# Patient Record
Sex: Female | Born: 1975 | Race: Black or African American | Hispanic: No | Marital: Single | Smoking: Never smoker
Health system: Southern US, Community
[De-identification: ages and names within clinical notes are randomized; demographics above are authoritative.]

## PROBLEM LIST (undated history)

## (undated) DIAGNOSIS — E785 Hyperlipidemia, unspecified: Secondary | ICD-10-CM

## (undated) DIAGNOSIS — I10 Essential (primary) hypertension: Secondary | ICD-10-CM

## (undated) DIAGNOSIS — E119 Type 2 diabetes mellitus without complications: Secondary | ICD-10-CM

## (undated) HISTORY — DX: Essential (primary) hypertension: I10

## (undated) HISTORY — DX: Hyperlipidemia, unspecified: E78.5

## (undated) HISTORY — DX: Type 2 diabetes mellitus without complications: E11.9

---

## 2018-02-11 ENCOUNTER — Telehealth: Payer: Self-pay | Admitting: Medical

## 2018-02-11 ENCOUNTER — Encounter: Payer: Self-pay | Admitting: Medical

## 2018-02-11 ENCOUNTER — Ambulatory Visit: Payer: BC Managed Care – PPO | Admitting: Medical

## 2018-02-11 VITALS — BP 175/92 | HR 93 | Temp 99.0°F | Resp 16 | Ht 66.0 in | Wt 226.0 lb

## 2018-02-11 DIAGNOSIS — I10 Essential (primary) hypertension: Secondary | ICD-10-CM | POA: Diagnosis not present

## 2018-02-11 DIAGNOSIS — Z23 Encounter for immunization: Secondary | ICD-10-CM | POA: Diagnosis not present

## 2018-02-11 DIAGNOSIS — R739 Hyperglycemia, unspecified: Secondary | ICD-10-CM | POA: Diagnosis not present

## 2018-02-11 DIAGNOSIS — R5383 Other fatigue: Secondary | ICD-10-CM | POA: Diagnosis not present

## 2018-02-11 LAB — LIPID PANEL
CHOLESTEROL: 164 mg/dL (ref 0–200)
HDL: 31.7 mg/dL — ABNORMAL LOW (ref >39.00)
LDL Cholesterol: 112 mg/dL — ABNORMAL HIGH (ref 0–99)
NonHDL: 132.72
Total CHOL/HDL Ratio: 5
Triglycerides: 104 mg/dL (ref 0.0–149.0)
VLDL: 20.8 mg/dL (ref 0.0–40.0)

## 2018-02-11 LAB — COMPREHENSIVE METABOLIC PANEL
ALBUMIN: 4.1 g/dL (ref 3.5–5.2)
ALT: 9 U/L (ref 0–35)
AST: 9 U/L (ref 0–37)
Alkaline Phosphatase: 88 U/L (ref 39–117)
BUN: 9 mg/dL (ref 6–23)
CO2: 28 mEq/L (ref 19–32)
Calcium: 9.4 mg/dL (ref 8.4–10.5)
Chloride: 100 mEq/L (ref 96–112)
Creatinine, Ser: 0.58 mg/dL (ref 0.40–1.20)
GFR: 144.89 mL/min (ref >60.00)
Glucose, Bld: 256 mg/dL — ABNORMAL HIGH (ref 70–99)
Potassium: 3.8 mEq/L (ref 3.5–5.1)
Sodium: 136 mEq/L (ref 135–145)
Total Bilirubin: 0.4 mg/dL (ref 0.2–1.2)
Total Protein: 7.3 g/dL (ref 6.0–8.3)

## 2018-02-11 LAB — CBC WITH DIFFERENTIAL/PLATELET
Basophils Absolute: 0.1 10*3/uL (ref 0.0–0.1)
Basophils Relative: 0.9 % (ref 0.0–3.0)
Eosinophils Absolute: 0.1 10*3/uL (ref 0.0–0.7)
Eosinophils Relative: 0.7 % (ref 0.0–5.0)
HCT: 34.8 % — ABNORMAL LOW (ref 36.0–46.0)
Hemoglobin: 10.9 g/dL — ABNORMAL LOW (ref 12.0–15.0)
Lymphocytes Relative: 25.5 % (ref 12.0–46.0)
Lymphs Abs: 2.5 10*3/uL (ref 0.7–4.0)
MCHC: 31.3 g/dL (ref 30.0–36.0)
MCV: 73.3 fl — ABNORMAL LOW (ref 78.0–100.0)
Monocytes Absolute: 0.4 10*3/uL (ref 0.1–1.0)
Monocytes Relative: 3.8 % (ref 3.0–12.0)
Neutro Abs: 6.8 10*3/uL (ref 1.4–7.7)
Neutrophils Relative %: 69.1 % (ref 43.0–77.0)
Platelets: 541 10*3/uL — ABNORMAL HIGH (ref 150.0–400.0)
RBC: 4.75 Mil/uL (ref 3.87–5.11)
RDW: 16.5 % — ABNORMAL HIGH (ref 11.5–15.5)
WBC: 9.8 10*3/uL (ref 4.0–10.5)

## 2018-02-11 LAB — VITAMIN B12: Vitamin B-12: 172 pg/mL — ABNORMAL LOW (ref 211–911)

## 2018-02-11 LAB — HEMOGLOBIN A1C: HEMOGLOBIN A1C: 12.3 % — AB (ref 4.6–6.5)

## 2018-02-11 LAB — TSH: TSH: 1.25 u[IU]/mL (ref 0.35–4.50)

## 2018-02-11 MED ORDER — LOSARTAN POTASSIUM 100 MG PO TABS: 100.0000 mg | ORAL_TABLET | Freq: Every day | ORAL | 3 refills | Status: DC

## 2018-02-11 MED ORDER — ATORVASTATIN CALCIUM 20 MG PO TABS: 20.0000 mg | ORAL_TABLET | Freq: Every day | ORAL | 3 refills | Status: DC

## 2018-02-11 MED ORDER — METFORMIN HCL 500 MG PO TABS: ORAL_TABLET | ORAL | 3 refills | Status: DC

## 2018-02-11 MED ORDER — HYDROCHLOROTHIAZIDE 12.5 MG PO CAPS: 12.5000 mg | ORAL_CAPSULE | Freq: Every day | ORAL | 3 refills | Status: DC

## 2018-02-11 MED ORDER — GLIPIZIDE 5 MG PO TABS: 5.0000 mg | ORAL_TABLET | Freq: Every day | ORAL | 3 refills | Status: DC

## 2018-02-11 NOTE — Telephone Encounter (Signed)
Rx metfomrin, glipizide and atorvastatin sent to pt pharmacy.

## 2018-02-11 NOTE — Patient Instructions (Signed)
Your blood pressure is very high today.  But you had normal neurologic exam.  I did prescribe you both losartan and HCTZ.  This medication should gradually bring your blood pressure to a more reasonable level.  If you have any cardiac or neurologic signs or symptoms over the holiday weekend then recommend ED evaluation.  Recommended to get electronic blood pressure cuff over-the-counter and asked to check your blood pressures daily.  You might take 3 to 4 days before you start to see blood pressure come down significantly.  You have a history of elevated sugar in the past and will get 2457-month blood sugar average test today.  For history of fatigue, will get CBC, CMP, B12, B1 level and a vitamin D level.  You have moderate high level depression score today.  Though you report not being depressed.  I want you to be aware of your moods and if he is you start to have overt feelings of depression then could give medication.  Follow-up in 10 to 14 days or as needed.

## 2018-02-11 NOTE — Progress Notes (Signed)
Subjective:    Patient ID: Adrienne Thompson, female    DOB: [birth year], 42 y.o.   MRN: [id number]  HPI  Pt in for first time.  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 She does not exercise regularly. She admits not eating healthy. Nonsmoker. No alcohol. Single. Has one son.  Pt has no regular pcp.  Pt has no hx of htn. But her bp was initially high. No cardiac or neurologic signs or symptoms. Pt had high bp at dentist office recently. Her bp was 210/110 and 209/116 in November.  Some daily mild fatigue   Pt has some elevated blood sugar in the past. Pt thinks she was diagnosed with prediabetes about 5 years ago.  Patient has not had flu vaccine this year and she requests one today.  Review of Systems  Constitutional: Positive for fatigue. Negative for chills and unexpected weight change.       Mild fatigue for 2 months.  HENT: Negative for congestion and ear discharge.   Respiratory: Negative for chest tightness, shortness of breath and wheezing.   Cardiovascular: Negative for chest pain and palpitations.  Gastrointestinal: Negative for abdominal pain.  Musculoskeletal: Negative for back pain.       No leg pain  Neurological: Negative for dizziness, syncope, weakness, numbness and headaches.  Hematological: Negative for adenopathy. Does not bruise/bleed easily.  Psychiatric/Behavioral: Negative for behavioral problems, confusion and sleep disturbance. The patient is not nervous/anxious.        High phq-9 score of 10. But a lot of points maybe attributed to her fatigue and poor sleep. She denies feeling depressed.    Past Medical History:  Diagnosis Date  . Diabetes mellitus without complication (HCC)   . Hypertension      Social History   Socioeconomic History  . Marital status: Single    Spouse name: Not on file  . Number of children: Not on file  . Years of education: Not on file  . Highest education level: Not on file  Occupational History  . Not on file  Social Needs  .  Financial resource strain: Not on file  . Food insecurity:    Worry: Not on file    Inability: Not on file  . Transportation needs:    Medical: Not on file    Non-medical: Not on file  Tobacco Use  . Smoking status: Never Smoker  . Smokeless tobacco: Never Used  Substance and Sexual Activity  . Alcohol use: Never    Frequency: Never  . Drug use: Never  . Sexual activity: Not on file  Lifestyle  . Physical activity:    Days per week: Not on file    Minutes per session: Not on file  . Stress: Not on file  Relationships  . Social connections:    Talks on phone: Not on file    Gets together: Not on file    Attends religious service: Not on file    Active member of club or organization: Not on file    Attends meetings of clubs or organizations: Not on file    Relationship status: Not on file  . Intimate partner violence:    Fear of current or ex partner: Not on file    Emotionally abused: Not on file    Physically abused: Not on file    Forced sexual activity: Not on file  Other Topics Concern  . Not on file  Social History Narrative  . Not on file    History reviewed.  No pertinent surgical history.  Family History  Problem Relation Age of Onset  . Hypertension Mother   . Diabetes Mother     Not on File  No current outpatient medications on file prior to visit.   No current facility-administered medications on file prior to visit.     BP (!) 176/98   Pulse 93   Temp 99 F (37.2 C) (Oral)   Resp 16   Ht 5\' 6"  (1.676 m)   Wt 226 lb (102.5 kg)   LMP 12/26/2017   SpO2 100%   BMI 36.48 kg/m       Objective:   Physical Exam  General Mental Status- Alert. General Appearance- Not in acute distress.   Skin General: Color- Normal Color. Moisture- Normal Moisture.  Neck Carotid Arteries- Normal color. Moisture- Normal Moisture. No carotid bruits. No JVD.  Chest and Lung Exam Auscultation: Breath Sounds:-Normal.  Cardiovascular Auscultation:Rythm-  Regular. Murmurs & Other Heart Sounds:Auscultation of the heart reveals- No Murmurs.  Abdomen Inspection:-Inspeection Normal. Palpation/Percussion:Note:No mass. Palpation and Percussion of the abdomen reveal- Non Tender, Non Distended + BS, no rebound or guarding.   Neurologic Cranial Nerve exam:- CN III-XII intact(No nystagmus), symmetric smile. Drift Test:- No drift. Romberg Exam:- Negative.  Heal to Toe Gait exam:-Normal. Finger to Nose:- Normal/Intact Strength:- 5/5 equal and symmetric strength both upper and lower extremities.      Assessment & Plan:  Your blood pressure is very high today.  But you had normal neurologic exam.  I did prescribe you both losartan and HCTZ.  This medication should gradually bring your blood pressure to a more reasonable level.  If you have any cardiac or neurologic signs or symptoms over the holiday weekend then recommend ED evaluation.  Recommended to get electronic blood pressure cuff over-the-counter and asked to check your blood pressures daily.  You might take 3 to 4 days before you start to see blood pressure come down significantly.  You have a history of elevated sugar in the past and will get 5959-month blood sugar average test today.  For history of fatigue, will get CBC, CMP, B12, B1 level and a vitamin D level.  You have moderate high level depression score today.  Though you report not being depressed.  I want you to be aware of your moods and if he is you start to have overt feelings of depression then could give medication.  Follow-up in 10 to 14 days or as needed.  Esperanza RichtersEdward Latonya Knight, PA-C

## 2018-02-15 ENCOUNTER — Telehealth: Payer: Self-pay | Admitting: Medical

## 2018-02-15 LAB — VITAMIN D 1,25 DIHYDROXY
VITAMIN D3 1, 25 (OH): 66 pg/mL
Vitamin D 1, 25 (OH)2 Total: 66 pg/mL (ref 18–72)
Vitamin D2 1, 25 (OH)2: <8 pg/mL

## 2018-02-15 LAB — VITAMIN B1: Vitamin B1 (Thiamine): <6 nmol/L — ABNORMAL LOW (ref 8–30)

## 2018-02-15 NOTE — Telephone Encounter (Signed)
Pt given results and recommendations per notes of Edward,PA on 02/05/18. Pt verbalized understanding.Unable to document in result note due to [facility].

## 2018-02-18 ENCOUNTER — Encounter: Payer: Self-pay | Admitting: Medical

## 2018-02-18 ENCOUNTER — Ambulatory Visit: Payer: BC Managed Care – PPO | Admitting: Medical

## 2018-02-18 VITALS — BP 154/92 | HR 100 | Temp 98.4°F | Resp 16 | Ht 66.0 in | Wt 222.4 lb

## 2018-02-18 DIAGNOSIS — Z23 Encounter for immunization: Secondary | ICD-10-CM

## 2018-02-18 DIAGNOSIS — I10 Essential (primary) hypertension: Secondary | ICD-10-CM | POA: Diagnosis not present

## 2018-02-18 DIAGNOSIS — E538 Deficiency of other specified B group vitamins: Secondary | ICD-10-CM | POA: Diagnosis not present

## 2018-02-18 DIAGNOSIS — E1165 Type 2 diabetes mellitus with hyperglycemia: Secondary | ICD-10-CM

## 2018-02-18 DIAGNOSIS — E1169 Type 2 diabetes mellitus with other specified complication: Secondary | ICD-10-CM | POA: Diagnosis not present

## 2018-02-18 DIAGNOSIS — E785 Hyperlipidemia, unspecified: Secondary | ICD-10-CM

## 2018-02-18 MED ORDER — CYANOCOBALAMIN 1000 MCG/ML IJ SOLN
1000.0000 ug | Freq: Once | INTRAMUSCULAR | Status: AC
  Administered 2018-02-18: 1000 ug via INTRAMUSCULAR

## 2018-02-18 MED ORDER — CHLORTHALIDONE 25 MG PO TABS: 25.0000 mg | ORAL_TABLET | Freq: Every day | ORAL | 3 refills | Status: DC

## 2018-02-18 NOTE — Progress Notes (Signed)
Subjective:    Patient ID: Adrienne Thompson, female    DOB: Sep 28, 1975, 42 y.o.   MRN: [id number]  HPI  Pt in for follow up. She is aware of high A1-C average. Pt started both metformin and glipizide.  Pt having some mild loose stools with metformin. Started out with 1 tab po bid dose.  Pt also on statin just recently.  She had low b12 on lab check. Will get b12 vitamin today.  Pt will get pneumonia vaccine today.      Review of Systems  Constitutional: Negative for chills, fatigue and fever.  HENT: Negative for congestion, ear discharge and sinus pain.   Respiratory: Negative for cough, chest tightness, shortness of breath and wheezing.   Cardiovascular: Negative for chest pain and palpitations.  Gastrointestinal: Negative for abdominal pain, constipation, nausea and vomiting.  Genitourinary: Negative for difficulty urinating, dysuria, enuresis, pelvic pain and urgency.  Musculoskeletal: Negative for back pain and neck pain.  Skin: Negative for rash.  Neurological: Negative for dizziness, speech difficulty, weakness and headaches.  Hematological: Negative for adenopathy. Does not bruise/bleed easily.  Psychiatric/Behavioral: Negative for behavioral problems, confusion, hallucinations and sleep disturbance. The patient is not nervous/anxious.     Past Medical History:  Diagnosis Date  . Diabetes mellitus without complication (HCC)   . Hypertension      Social History   Socioeconomic History  . Marital status: Single    Spouse name: Not on file  . Number of children: Not on file  . Years of education: Not on file  . Highest education level: Not on file  Occupational History  . Not on file  Social Needs  . Financial resource strain: Not on file  . Food insecurity:    Worry: Not on file    Inability: Not on file  . Transportation needs:    Medical: Not on file    Non-medical: Not on file  Tobacco Use  . Smoking status: Never Smoker  . Smokeless tobacco: Never Used   Substance and Sexual Activity  . Alcohol use: Never    Frequency: Never  . Drug use: Never  . Sexual activity: Not on file  Lifestyle  . Physical activity:    Days per week: Not on file    Minutes per session: Not on file  . Stress: Not on file  Relationships  . Social connections:    Talks on phone: Not on file    Gets together: Not on file    Attends religious service: Not on file    Active member of club or organization: Not on file    Attends meetings of clubs or organizations: Not on file    Relationship status: Not on file  . Intimate partner violence:    Fear of current or ex partner: Not on file    Emotionally abused: Not on file    Physically abused: Not on file    Forced sexual activity: Not on file  Other Topics Concern  . Not on file  Social History Narrative  . Not on file    No past surgical history on file.  Family History  Problem Relation Age of Onset  . Hypertension Mother   . Diabetes Mother     Not on File  Current Outpatient Medications on File Prior to Visit  Medication Sig Dispense Refill  . atorvastatin (LIPITOR) 20 MG tablet Take 1 tablet (20 mg total) by mouth daily. 30 tablet 3  . glipiZIDE (GLUCOTROL) 5 MG tablet  Take 1 tablet (5 mg total) by mouth daily before breakfast. 30 tablet 3  . hydrochlorothiazide (MICROZIDE) 12.5 MG capsule Take 1 capsule (12.5 mg total) by mouth daily. 30 capsule 3  . losartan (COZAAR) 100 MG tablet Take 1 tablet (100 mg total) by mouth daily. 30 tablet 3  . metFORMIN (GLUCOPHAGE) 500 MG tablet 2 tab po bid 60 tablet 3   No current facility-administered medications on file prior to visit.     BP (!) 154/92   Pulse 100   Temp 98.4 F (36.9 C) (Oral)   Resp 16   Ht 5\' 6"  (1.676 m)   Wt 222 lb 6.4 oz (100.9 kg)   SpO2 100%   BMI 35.90 kg/m       Objective:   Physical Exam  General Mental Status- Alert. General Appearance- Not in acute distress.   Skin General: Color- Normal Color. Moisture-  Normal Moisture.  Neck Carotid Arteries- Normal color. Moisture- Normal Moisture. No carotid bruits. No JVD.  Chest and Lung Exam Auscultation: Breath Sounds:-Normal.  Cardiovascular Auscultation:Rythm- Regular. Murmurs & Other Heart Sounds:Auscultation of the heart reveals- No Murmurs.  Abdomen Inspection:-Inspeection Normal. Palpation/Percussion:Note:No mass. Palpation and Percussion of the abdomen reveal- Non Tender, Non Distended + BS, no rebound or guarding.    Neurologic Cranial Nerve exam:- CN III-XII intact(No nystagmus), symmetric smile. Strength:- 5/5 equal and symmetric strength both upper and lower extremities.  Lower ext- see quality metrics.     Assessment & Plan:  For diabetes, I want you to continue with metformin and titrate up to 2 tablets twice daily as instructed.  Continue with the glipizide.  I have placed referral to diabetic/nutrition counselor.  Hopefully will call within the next couple weeks.  If not please notify me.  Your blood pressure has come down a lot since last visit.  Would recommend that you continue losartan.  I think it we will be better for you to be on chlorthalidone 25mg  daily and discontinue the HCTZ 12.5 mg.  Please make sure that you have potassium rich diet while on this diuretic.  For high cholesterol, continue with a statin medication as prescribed.  For low B12, we gave you B12 injection today.  Also we gave you pneumonia vaccine.  Please remember to call your insurance company and ask them if they have a glucometer that they will give you or if they can give you the name of their preferred brand glucometer.  This way we can send prescription to your pharmacy along with prescription for supplies.  I would like for you to check your sugars fasting in the morning and at least another time after you eat.  This way he will get idea of with particular foods make sugar increase.  Follow-up in 1 month or as needed.   Esperanza RichtersEdward Torrell Krutz,  PA-C

## 2018-02-18 NOTE — Patient Instructions (Signed)
For diabetes, I want you to continue with metformin and titrate up to 2 tablets twice daily as instructed.  Continue with the glipizide.  I have placed referral to diabetic/nutrition counselor.  Hopefully will call within the next couple weeks.  If not please notify me.  Your blood pressure has come down a lot since last visit.  Would recommend that you continue losartan.  I think it we will be better for you to be on chlorthalidone 25mg  daily and discontinue the HCTZ 12.5 mg.  Please make sure that you have potassium rich diet while on this diuretic.  For high cholesterol, continue with a statin medication as prescribed.  For low B12, we gave you B12 injection today.  Also we gave you pneumonia vaccine.  Please remember to call your insurance company and ask them if they have a glucometer that they will give you or if they can give you the name of their preferred brand glucometer.  This way we can send prescription to your pharmacy along with prescription for supplies.  I would like for you to check your sugars fasting in the morning and at least another time after you eat.  This way he will get idea of with particular foods make sugar increase.  Follow-up in 1 month or as needed.

## 2018-02-28 ENCOUNTER — Encounter: Payer: Self-pay | Admitting: Medical

## 2018-02-28 ENCOUNTER — Ambulatory Visit: Payer: BC Managed Care – PPO | Admitting: Medical

## 2018-02-28 VITALS — BP 132/80 | HR 92 | Temp 98.3°F | Resp 16 | Ht 66.0 in | Wt 221.6 lb

## 2018-02-28 DIAGNOSIS — E1169 Type 2 diabetes mellitus with other specified complication: Secondary | ICD-10-CM | POA: Diagnosis not present

## 2018-02-28 DIAGNOSIS — E1165 Type 2 diabetes mellitus with hyperglycemia: Secondary | ICD-10-CM | POA: Diagnosis not present

## 2018-02-28 DIAGNOSIS — E785 Hyperlipidemia, unspecified: Secondary | ICD-10-CM | POA: Diagnosis not present

## 2018-02-28 DIAGNOSIS — I10 Essential (primary) hypertension: Secondary | ICD-10-CM | POA: Diagnosis not present

## 2018-02-28 NOTE — Patient Instructions (Signed)
Your blood pressure is well controlled today.  I want you to continue your current blood pressure medication regimen and gave you recommend getting that over-the-counter blood pressure cuff so you can make sure that it is staying in the 130/80 range.  For diabetes, continue with the metformin and glipizide.  Would again remind you to touch base with your insurance company regarding if they can give you a free glucometer or the name of the preferred brand.  If you give [place] that name we can send in a prescription for the glucometer.  Once you get glucometer would recommend checking sugars fasting in the morning and then 1 other time after meal.  Continue Lipitor for high cholesterol and eat low-cholesterol diet.  Also recommend some daily exercise.  Follow-up late May 16, 2017 or as needed.

## 2018-02-28 NOTE — Progress Notes (Signed)
Subjective:    Patient ID: Adrienne Thompson, female    DOB: 24-Feb-1975, 43 y.o.   MRN: [id number]  HPI  Pt in for follow up on htn. Pt bp is better than it was. 131/88 on first check by MA. On second check by myself was 132/80. She has not gotten bp cuff otc as I had recommended.   Pt did start lipiitor medication.  Pt is taking meoformin and glipizide. She did not get [place] name of glucometer that insurance company prefers. Reminded her to do so and ask if they would give her free glucometer. Pt thinks she got call from diabetic educator. Asked please call them back.    Review of Systems  Constitutional: Negative for chills, fatigue and fever.  Respiratory: Negative for cough, chest tightness and wheezing.   Cardiovascular: Negative for chest pain and palpitations.  Gastrointestinal: Negative for abdominal pain.  Endocrine: Negative for polydipsia, polyphagia and polyuria.  Neurological: Negative for dizziness and headaches.  Hematological: Negative for adenopathy. Does not bruise/bleed easily.  Psychiatric/Behavioral: Negative for behavioral problems, decreased concentration and sleep disturbance. The patient is not nervous/anxious.     Past Medical History:  Diagnosis Date  . Diabetes mellitus without complication (HCC)   . Hypertension      Social History   Socioeconomic History  . Marital status: Single    Spouse name: Not on file  . Number of children: Not on file  . Years of education: Not on file  . Highest education level: Not on file  Occupational History  . Not on file  Social Needs  . Financial resource strain: Not on file  . Food insecurity:    Worry: Not on file    Inability: Not on file  . Transportation needs:    Medical: Not on file    Non-medical: Not on file  Tobacco Use  . Smoking status: Never Smoker  . Smokeless tobacco: Never Used  Substance and Sexual Activity  . Alcohol use: Never    Frequency: Never  . Drug use: Never  . Sexual activity:  Not on file  Lifestyle  . Physical activity:    Days per week: Not on file    Minutes per session: Not on file  . Stress: Not on file  Relationships  . Social connections:    Talks on phone: Not on file    Gets together: Not on file    Attends religious service: Not on file    Active member of club or organization: Not on file    Attends meetings of clubs or organizations: Not on file    Relationship status: Not on file  . Intimate partner violence:    Fear of current or ex partner: Not on file    Emotionally abused: Not on file    Physically abused: Not on file    Forced sexual activity: Not on file  Other Topics Concern  . Not on file  Social History Narrative  . Not on file    No past surgical history on file.  Family History  Problem Relation Age of Onset  . Hypertension Mother   . Diabetes Mother     Not on File  Current Outpatient Medications on File Prior to Visit  Medication Sig Dispense Refill  . atorvastatin (LIPITOR) 20 MG tablet Take 1 tablet (20 mg total) by mouth daily. 30 tablet 3  . chlorthalidone (HYGROTON) 25 MG tablet Take 1 tablet (25 mg total) by mouth daily. 30 tablet 3  .  glipiZIDE (GLUCOTROL) 5 MG tablet Take 1 tablet (5 mg total) by mouth daily before breakfast. 30 tablet 3  . losartan (COZAAR) 100 MG tablet Take 1 tablet (100 mg total) by mouth daily. 30 tablet 3  . metFORMIN (GLUCOPHAGE) 500 MG tablet 2 tab po bid 60 tablet 3   No current facility-administered medications on file prior to visit.     BP 132/80   Pulse 92   Temp 98.3 F (36.8 C) (Oral)   Resp 16   Ht 5\' 6"  (1.676 m)   Wt 221 lb 9.6 oz (100.5 kg)   SpO2 100%   BMI 35.77 kg/m       Objective:   Physical Exam  General- No acute distress. Pleasant patient. Neck- Full range of motion, no jvd Lungs- Clear, even and unlabored. Heart- regular rate and rhythm. Neurologic- CNII- XII grossly intact.       Assessment & Plan:  Your blood pressure is well controlled  today.  I want you to continue your current blood pressure medication regimen and gave you recommend getting that over-the-counter blood pressure cuff so you can make sure that it is staying in the 130/80 range.  For diabetes, continue with the metformin and glipizide.  Would again remind you to touch base with your insurance company regarding if they can give you a free glucometer or the name of the preferred brand.  If you give us that name we can send in a prescription for the glucometer.  Once you get glucometer would recommend checking sugars fasting in the morning and then 1 other time after meal.  Continue Lipitor for high cholesterol and eat low-cholesterol diet.  Also recommend some daily exercise.  Follow-up late May 16, 2017 or as needed.   25 minutes spent with pt. 50% of time spent on couselng pt on treatment plan, compliance and need to get glucometer and bp cuff.  Esperanza RichtersEdward Yariel Ferraris, PA-C

## 2018-05-16 ENCOUNTER — Other Ambulatory Visit: Payer: Self-pay

## 2018-05-16 ENCOUNTER — Ambulatory Visit (INDEPENDENT_AMBULATORY_CARE_PROVIDER_SITE_OTHER): Payer: BC Managed Care – PPO | Admitting: Medical

## 2018-05-16 DIAGNOSIS — E785 Hyperlipidemia, unspecified: Secondary | ICD-10-CM

## 2018-05-16 DIAGNOSIS — E538 Deficiency of other specified B group vitamins: Secondary | ICD-10-CM

## 2018-05-16 DIAGNOSIS — I10 Essential (primary) hypertension: Secondary | ICD-10-CM | POA: Diagnosis not present

## 2018-05-16 DIAGNOSIS — E1169 Type 2 diabetes mellitus with other specified complication: Secondary | ICD-10-CM | POA: Diagnosis not present

## 2018-05-16 DIAGNOSIS — E1165 Type 2 diabetes mellitus with hyperglycemia: Secondary | ICD-10-CM | POA: Diagnosis not present

## 2018-05-16 NOTE — Patient Instructions (Addendum)
For diabetes, I want patient to continue metformin twice daily and continue with sulfonylurea.  Did explain in detail benefit of knowing what her blood sugars are.  She has not got a glucometer yet.  Explained that she can call her insurance directly and see if they give her free glucometer or she can get the brand name of preferred glucometer.  If they give her the name of the glucometer then I could call/write a prescription for her to get that.  Once patient has glucometer I want her to check her blood sugars fasting in the morning and another time after she eats.  If her sugars are not in the 100 range then I think Ozempic might be a good option for her.  I asked her to call in blood sugar readings in 2 weeks or MyChart [place] the readings.  For high blood pressure history, I asked her to get a blood pressure machine over-the-counter.  I explained under the current environment that there might be a run on electronic blood pressure cuff.  So I asked her to go ahead and get one so she can check her blood pressure occasionally.  Particularly this would be beneficial if she gets any neurologic type signs or symptoms.  Patient expressed understanding.  For high cholesterol, advised continue statin medication and eat low-cholesterol diet.  Encouraged her to get daily exercise.  For history of low B12, I asked her to get over-the-counter B12 tablets and take 1 tablet a day.  Now since community is in a stay in place order she cannot get B12 injections.  When order is listed then will get a B12 level.  For follow-up date in office to be determined depending on viral pandemic situation.  But did encourage her to give [place] update on sugars in 2 weeks and to call us if there is any issues.

## 2018-05-16 NOTE — Progress Notes (Addendum)
Subjective:    Patient ID: Adrienne Thompson, female    DOB: 08-Aug-1975, 43 y.o.   MRN: [id number]  HPI  Pt doing web ex visit today.  This was private visit done in my office.  She has not been checking her sugars. A1c was 12.3 in December, 23 2019. She has not been exercising but cut back on her carbs by about 1/2. Pt states taking metformin and glyburide. She can only tolerate metformin 1 tab po bid.   Pt has low b12 hx. Now can't take b12 injection. She will get otc tabs.  Pt also had potential dental procedures but a lot of dentist have stopped doing elective procedures.  Pt expresses concern about   Pt does not have bp machine.     Review of Systems  Constitutional: Negative for chills, fatigue and fever.  HENT: Negative for congestion, drooling, facial swelling, mouth sores and postnasal drip.   Respiratory: Negative for cough, chest tightness, shortness of breath, wheezing and stridor.   Cardiovascular: Negative for chest pain and palpitations.  Gastrointestinal: Negative for abdominal distention, abdominal pain, blood in stool, diarrhea and rectal pain.  Endocrine: Negative for polydipsia, polyphagia and polyuria.  Genitourinary: Negative for dysuria, enuresis, flank pain, genital sores and pelvic pain.  Musculoskeletal: Negative for back pain, gait problem and joint swelling.  Skin: Negative for pallor and rash.  Neurological: Negative for dizziness, syncope, speech difficulty, weakness, numbness and headaches.  Hematological: Negative for adenopathy. Does not bruise/bleed easily.  Psychiatric/Behavioral: Negative for behavioral problems, confusion, dysphoric mood, hallucinations and sleep disturbance. The patient is not nervous/anxious.     Past Medical History:  Diagnosis Date  . Diabetes mellitus without complication (HCC)   . Hypertension      Social History   Socioeconomic History  . Marital status: Single    Spouse name: Not on file  . Number of children:  Not on file  . Years of education: Not on file  . Highest education level: Not on file  Occupational History  . Not on file  Social Needs  . Financial resource strain: Not on file  . Food insecurity:    Worry: Not on file    Inability: Not on file  . Transportation needs:    Medical: Not on file    Non-medical: Not on file  Tobacco Use  . Smoking status: Never Smoker  . Smokeless tobacco: Never Used  Substance and Sexual Activity  . Alcohol use: Never    Frequency: Never  . Drug use: Never  . Sexual activity: Not on file  Lifestyle  . Physical activity:    Days per week: Not on file    Minutes per session: Not on file  . Stress: Not on file  Relationships  . Social connections:    Talks on phone: Not on file    Gets together: Not on file    Attends religious service: Not on file    Active member of club or organization: Not on file    Attends meetings of clubs or organizations: Not on file    Relationship status: Not on file  . Intimate partner violence:    Fear of current or ex partner: Not on file    Emotionally abused: Not on file    Physically abused: Not on file    Forced sexual activity: Not on file  Other Topics Concern  . Not on file  Social History Narrative  . Not on file    No past  surgical history on file.  Family History  Problem Relation Age of Onset  . Hypertension Mother   . Diabetes Mother     Not on File  Current Outpatient Medications on File Prior to Visit  Medication Sig Dispense Refill  . atorvastatin (LIPITOR) 20 MG tablet Take 1 tablet (20 mg total) by mouth daily. 30 tablet 3  . chlorthalidone (HYGROTON) 25 MG tablet Take 1 tablet (25 mg total) by mouth daily. 30 tablet 3  . glipiZIDE (GLUCOTROL) 5 MG tablet Take 1 tablet (5 mg total) by mouth daily before breakfast. 30 tablet 3  . losartan (COZAAR) 100 MG tablet Take 1 tablet (100 mg total) by mouth daily. 30 tablet 3  . metFORMIN (GLUCOPHAGE) 500 MG tablet 2 tab po bid 60 tablet  3   No current facility-administered medications on file prior to visit.     There were no vitals taken for this visit.      Objective:   Physical Exam  . Na. webex.     Assessment & Plan:  Pt does not have fax number for me to send work note. He return to work would be this Friday. It is unclear what her bus duty would be.(she wants me to inlclude she is diabetic in the note. She also mentions they know this already).  For diabetes, I want patient to continue metformin twice daily and continue with sulfonylurea.  Did explain in detail benefit of knowing what her blood sugars are.  She has not got a glucometer yet.  Explained that she can call her insurance directly and see if they give her free glucometer or she can get the brand name of preferred glucometer.  If they give her the name of the glucometer then I could call/write a prescription for her to get that.  Once patient has glucometer I want her to check her blood sugars fasting in the morning and another time after she eats.  If her sugars are not in the 100 range then I think Ozempic might be a good option for her.  I asked her to call in blood sugar readings in 2 weeks or MyChart [place] the readings.  For high blood pressure history, I asked her to get a blood pressure machine over-the-counter.  I explained under the current environment that there might be a run on electronic blood pressure cuff.  So I asked her to go ahead and get one so she can check her blood pressure occasionally.  Particularly this would be beneficial if she gets any neurologic type signs or symptoms.  Patient expressed understanding.  For high cholesterol, advised continue statin medication and eat low-cholesterol diet.  Encouraged her to get daily exercise.  For history of low B12, I asked her to get over-the-counter B12 tablets and take 1 tablet a day.  Now since community is in a stay in place order she cannot get B12 injections.  When order is listed then  will get a B12 level.  For follow-up date in office to be determined depending on viral pandemic situation.  But did encourage her to give [place] update on sugars in 2 weeks and to call us if there is any issues.  40 minutes spent with patient today.  We discussed her chronic medical problems and I did answer all her questions.  Particularly discussed her risk factors in the event that she were to get covid infection.  Counseled her extensively on the importance verifying that her sugars are better controlled  and if not then I can give her additional medication.

## 2018-05-17 ENCOUNTER — Ambulatory Visit: Payer: BC Managed Care – PPO | Admitting: Medical

## 2018-07-30 ENCOUNTER — Telehealth: Payer: Self-pay | Admitting: Medical

## 2018-07-30 NOTE — Telephone Encounter (Signed)
Would you call pt and get her scheduled for first week of July. Follow up for diabetes and high cholesterol.

## 2018-08-01 NOTE — Telephone Encounter (Signed)
Left pt a message to call back to schedule appointment.  

## 2018-08-21 ENCOUNTER — Ambulatory Visit: Payer: BC Managed Care – PPO | Admitting: Medical

## 2018-08-21 ENCOUNTER — Encounter: Payer: Self-pay | Admitting: Medical

## 2018-08-21 ENCOUNTER — Telehealth: Payer: Self-pay | Admitting: Medical

## 2018-08-21 ENCOUNTER — Other Ambulatory Visit: Payer: Self-pay

## 2018-08-21 VITALS — BP 119/72 | HR 91 | Temp 98.6°F | Resp 16 | Ht 66.0 in | Wt 224.2 lb

## 2018-08-21 DIAGNOSIS — E119 Type 2 diabetes mellitus without complications: Secondary | ICD-10-CM

## 2018-08-21 DIAGNOSIS — Z1239 Encounter for other screening for malignant neoplasm of breast: Secondary | ICD-10-CM

## 2018-08-21 DIAGNOSIS — E538 Deficiency of other specified B group vitamins: Secondary | ICD-10-CM | POA: Diagnosis not present

## 2018-08-21 DIAGNOSIS — Z124 Encounter for screening for malignant neoplasm of cervix: Secondary | ICD-10-CM | POA: Diagnosis not present

## 2018-08-21 DIAGNOSIS — E1165 Type 2 diabetes mellitus with hyperglycemia: Secondary | ICD-10-CM

## 2018-08-21 DIAGNOSIS — I10 Essential (primary) hypertension: Secondary | ICD-10-CM

## 2018-08-21 DIAGNOSIS — E1169 Type 2 diabetes mellitus with other specified complication: Secondary | ICD-10-CM | POA: Diagnosis not present

## 2018-08-21 DIAGNOSIS — E785 Hyperlipidemia, unspecified: Secondary | ICD-10-CM

## 2018-08-21 DIAGNOSIS — Z0100 Encounter for examination of eyes and vision without abnormal findings: Secondary | ICD-10-CM

## 2018-08-21 DIAGNOSIS — Z23 Encounter for immunization: Secondary | ICD-10-CM | POA: Diagnosis not present

## 2018-08-21 LAB — COMPREHENSIVE METABOLIC PANEL
ALT: 26 U/L (ref 0–35)
AST: 27 U/L (ref 0–37)
Albumin: 4.1 g/dL (ref 3.5–5.2)
Alkaline Phosphatase: 89 U/L (ref 39–117)
BUN: 16 mg/dL (ref 6–23)
CO2: 26 mEq/L (ref 19–32)
Calcium: 9.5 mg/dL (ref 8.4–10.5)
Chloride: 95 mEq/L — ABNORMAL LOW (ref 96–112)
Creatinine, Ser: 1.25 mg/dL — ABNORMAL HIGH (ref 0.40–1.20)
GFR: 56.07 mL/min — ABNORMAL LOW (ref >60.00)
Glucose, Bld: 345 mg/dL — ABNORMAL HIGH (ref 70–99)
Potassium: 4 mEq/L (ref 3.5–5.1)
Sodium: 134 mEq/L — ABNORMAL LOW (ref 135–145)
Total Bilirubin: 0.5 mg/dL (ref 0.2–1.2)
Total Protein: 7.6 g/dL (ref 6.0–8.3)

## 2018-08-21 LAB — LIPID PANEL
Cholesterol: 167 mg/dL (ref 0–200)
HDL: 29.9 mg/dL — ABNORMAL LOW (ref >39.00)
LDL Cholesterol: 111 mg/dL — ABNORMAL HIGH (ref 0–99)
NonHDL: 137.33
Total CHOL/HDL Ratio: 6
Triglycerides: 132 mg/dL (ref 0.0–149.0)
VLDL: 26.4 mg/dL (ref 0.0–40.0)

## 2018-08-21 LAB — HEMOGLOBIN A1C: Hgb A1c MFr Bld: 12.2 % — ABNORMAL HIGH (ref 4.6–6.5)

## 2018-08-21 MED ORDER — GLIPIZIDE 5 MG PO TABS: 5.0000 mg | ORAL_TABLET | Freq: Every day | ORAL | 3 refills | Status: DC

## 2018-08-21 MED ORDER — CHLORTHALIDONE 25 MG PO TABS: 25.0000 mg | ORAL_TABLET | Freq: Every day | ORAL | 3 refills | Status: DC

## 2018-08-21 MED ORDER — METFORMIN HCL 500 MG PO TABS: ORAL_TABLET | ORAL | 3 refills | Status: DC

## 2018-08-21 MED ORDER — LOSARTAN POTASSIUM 100 MG PO TABS: 100.0000 mg | ORAL_TABLET | Freq: Every day | ORAL | 3 refills | Status: DC

## 2018-08-21 MED ORDER — CYANOCOBALAMIN 1000 MCG/ML IJ SOLN
1000.0000 ug | Freq: Once | INTRAMUSCULAR | Status: AC
  Administered 2018-08-21: 09:00:00 1000 ug via INTRAMUSCULAR

## 2018-08-21 NOTE — Progress Notes (Signed)
Subjective:    Patient ID: Adrienne Thompson, female    DOB: [birth year], 43 y.o.   MRN: [id number]  HPI  Pt has follow up for htn, diabetes and high cholesterol.  For htn on chlorthalidone, and cozaar. No cardiac or neurologic signs or symptoms. Pt bp today 119/72.  For diabetes pt on glucophage and glucotrol. A1c  6 months ago 12.3. Referral placed for diabetic education in December. Referral note indicates pt refused service. Pt has not checked her sugars since last visit. She does not have glucometer.  For high cholesterol, She is on lipitor.  Pt has very high ASCVD score. She has been notified on lab review.  Needs screening pap.  Looks like not up to date on tdap.  Hx of low b12.Marland Kitchen. She want b12 injection.  She needs tdap today.   Needs dental form filled out since in November when bp was checked it was very high    Review of Systems  Constitutional: Positive for fatigue. Negative for activity change, chills, diaphoresis and fever.       Hx of low b12. Only got one injection then didn't return.  Respiratory: Negative for cough, chest tightness and shortness of breath.   Cardiovascular: Negative for chest pain, palpitations and leg swelling.  Gastrointestinal: Negative for abdominal pain, nausea and vomiting.  Genitourinary: Negative for difficulty urinating, dysuria, flank pain, frequency, menstrual problem, urgency and vaginal pain.  Musculoskeletal: Negative for neck pain and neck stiffness.  Skin: Negative for rash.  Neurological: Negative for dizziness, weakness, light-headedness and numbness.  Psychiatric/Behavioral: Negative for agitation, behavioral problems and confusion. The patient is not nervous/anxious.     Past Medical History:  Diagnosis Date  . Diabetes mellitus without complication (HCC)   . Hypertension      Social History   Socioeconomic History  . Marital status: Single    Spouse name: Not on file  . Number of children: Not on file  . Years of  education: Not on file  . Highest education level: Not on file  Occupational History  . Not on file  Social Needs  . Financial resource strain: Not on file  . Food insecurity    Worry: Not on file    Inability: Not on file  . Transportation needs    Medical: Not on file    Non-medical: Not on file  Tobacco Use  . Smoking status: Never Smoker  . Smokeless tobacco: Never Used  Substance and Sexual Activity  . Alcohol use: Never    Frequency: Never  . Drug use: Never  . Sexual activity: Not on file  Lifestyle  . Physical activity    Days per week: Not on file    Minutes per session: Not on file  . Stress: Not on file  Relationships  . Social Musicianconnections    Talks on phone: Not on file    Gets together: Not on file    Attends religious service: Not on file    Active member of club or organization: Not on file    Attends meetings of clubs or organizations: Not on file    Relationship status: Not on file  . Intimate partner violence    Fear of current or ex partner: Not on file    Emotionally abused: Not on file    Physically abused: Not on file    Forced sexual activity: Not on file  Other Topics Concern  . Not on file  Social History Narrative  . Not on  file    No past surgical history on file.  Family History  Problem Relation Age of Onset  . Hypertension Mother   . Diabetes Mother     Not on File  Current Outpatient Medications on File Prior to Visit  Medication Sig Dispense Refill  . atorvastatin (LIPITOR) 20 MG tablet Take 1 tablet (20 mg total) by mouth daily. 30 tablet 3  . chlorthalidone (HYGROTON) 25 MG tablet Take 1 tablet (25 mg total) by mouth daily. 30 tablet 3  . glipiZIDE (GLUCOTROL) 5 MG tablet Take 1 tablet (5 mg total) by mouth daily before breakfast. 30 tablet 3  . losartan (COZAAR) 100 MG tablet Take 1 tablet (100 mg total) by mouth daily. 30 tablet 3  . metFORMIN (GLUCOPHAGE) 500 MG tablet 2 tab po bid 60 tablet 3   No current  facility-administered medications on file prior to visit.     There were no vitals taken for this visit.     Objective:   Physical Exam  General Mental Status- Alert. General Appearance- Not in acute distress.   Skin General: Color- Normal Color. Moisture- Normal Moisture.  Neck Carotid Arteries- Normal color. Moisture- Normal Moisture. No carotid bruits. No JVD.  Chest and Lung Exam Auscultation: Breath Sounds:-Normal.  Cardiovascular Auscultation:Rythm- Regular. Murmurs & Other Heart Sounds:Auscultation of the heart reveals- No Murmurs.  Abdomen Inspection:-Inspeection Normal. Palpation/Percussion:Note:No mass. Palpation and Percussion of the abdomen reveal- Non Tender, Non Distended + BS, no rebound or guarding.   Neurologic Cranial Nerve exam:- CN III-XII intact(No nystagmus), symmetric smile. Strength:- 5/5 equal and symmetric strength both upper and lower extremities.      Assessment & Plan:  For htn, continue current bp meds and refilled those. Please get bp monitor for home use as will come in handy for potential virtual visits.  For diabetes continue current meds, low sugar diet and ask insurance company if they can give you glucometer to check sugars daily.  For high cholesterol will get cmp and lipid panel today.  For low b12 gave b12 injection. Repeat next month, tdap today.  tdap today.  Referral to gyn for pap.  Referral to eye md for diabetic eye exam.  Follow up date 3 months or as needed  25 minutes spent pt care today. 50% of time spent counseling on plan going forward for conditions  [employer], PA-C

## 2018-08-21 NOTE — Patient Instructions (Addendum)
For htn, continue current bp meds and refilled those. Please get bp monitor for home use as will come in handy for potential virtual visits.  For diabetes continue current meds, low sugar diet and ask insurance company if they can give you glucometer to check sugars daily.  For high cholesterol will get cmp and lipid panel today.  For low b12 gave b12 injection. Repeat next month, tdap today.  Referral to gyn for pap.  Referral to eye md for diabetic eye exam.  Follow up date 3 months or as needed

## 2018-08-21 NOTE — Telephone Encounter (Signed)
Referral to endocrine placed

## 2018-08-25 ENCOUNTER — Telehealth: Payer: Self-pay | Admitting: Medical

## 2018-08-25 NOTE — Telephone Encounter (Signed)
I finished the dental form that pt had for me to fill out. Will you fax that over. Will place that on your desk. Will you stamp my sig on form after faxed. Send to scanning.

## 2018-08-26 NOTE — Telephone Encounter (Signed)
Form faxed

## 2018-08-30 ENCOUNTER — Other Ambulatory Visit: Payer: Self-pay

## 2018-08-30 ENCOUNTER — Ambulatory Visit (HOSPITAL_BASED_OUTPATIENT_CLINIC_OR_DEPARTMENT_OTHER)
Admission: RE | Admit: 2018-08-30 | Discharge: 2018-08-30 | Disposition: A | Discharge status: Home / Self Care | Payer: BC Managed Care – PPO | Source: Ambulatory Visit | Attending: Medical | Admitting: Medical

## 2018-08-30 DIAGNOSIS — Z1231 Encounter for screening mammogram for malignant neoplasm of breast: Secondary | ICD-10-CM | POA: Diagnosis not present

## 2018-08-30 DIAGNOSIS — Z1239 Encounter for other screening for malignant neoplasm of breast: Secondary | ICD-10-CM

## 2018-09-13 ENCOUNTER — Ambulatory Visit: Payer: BC Managed Care – PPO | Admitting: Internal Medicine

## 2018-09-13 ENCOUNTER — Telehealth: Payer: Self-pay | Admitting: Internal Medicine

## 2018-09-13 ENCOUNTER — Encounter: Payer: Self-pay | Admitting: Internal Medicine

## 2018-09-13 ENCOUNTER — Other Ambulatory Visit: Payer: Self-pay

## 2018-09-13 VITALS — BP 148/100 | HR 97 | Temp 98.2°F | Ht 66.0 in | Wt 226.4 lb

## 2018-09-13 DIAGNOSIS — I10 Essential (primary) hypertension: Secondary | ICD-10-CM | POA: Insufficient documentation

## 2018-09-13 DIAGNOSIS — E1142 Type 2 diabetes mellitus with diabetic polyneuropathy: Secondary | ICD-10-CM

## 2018-09-13 DIAGNOSIS — E1165 Type 2 diabetes mellitus with hyperglycemia: Secondary | ICD-10-CM

## 2018-09-13 DIAGNOSIS — R739 Hyperglycemia, unspecified: Secondary | ICD-10-CM

## 2018-09-13 LAB — GLUCOSE, POCT (MANUAL RESULT ENTRY): POC Glucose: 297 mg/dl — AB (ref 70–99)

## 2018-09-13 MED ORDER — GLIPIZIDE 5 MG PO TABS: 10.0000 mg | ORAL_TABLET | Freq: Two times a day (BID) | ORAL | 3 refills | Status: DC

## 2018-09-13 MED ORDER — METFORMIN HCL 500 MG PO TABS: 500.0000 mg | ORAL_TABLET | Freq: Every day | ORAL | 3 refills | Status: DC

## 2018-09-13 NOTE — Telephone Encounter (Signed)
Adrienne clarify instructions

## 2018-09-13 NOTE — Progress Notes (Signed)
Name: Adrienne Thompson Clavin  MRN/ DOB: [id number], 01/20/1976   Age/ Sex: 43 y.o., female    PCP: Marisue BrooklynSaguier, Edward, PA-C   Reason for Endocrinology Evaluation: Type 2 Diabetes Mellitus     Date of Initial Endocrinology Visit: 09/13/2018     PATIENT IDENTIFIER: [name] is a 43 y.o. female with a past medical history of T2DM, HTN and dyslipidemia . The patient presented for initial endocrinology clinic visit on 09/13/2018 for consultative assistance with her diabetes management.    HPI: Adrienne Thompson was    Diagnosed with T2DM in12/2019 Prior Medications tried/Intolerance: n/a Currently checking blood sugars 0 x / day Hypoglycemia episodes : no Hemoglobin A1c has been > 12.0% since 2019 Patient required assistance for hypoglycemia: no Patient has required hospitalization within the last 1 year from hyper or hypoglycemia: no  In terms of diet, the patient eats 3 meals a day, snacks 2-3 times a day , the pt drinks sodas and juice with occasional sweet tea   [place]   Has a 45 yr old son.   HOME DIABETES REGIMEN: Metformin 500 mg 2 tabs BID - takes one daily  Glipizide 5 mg daily    Statin: yes ACE-I/ARB: yes Prior Diabetic Education: no   METER DOWNLOAD SUMMARY:did not bring     DIABETIC COMPLICATIONS: Microvascular complications:   Neuropathy   Denies: CKD, retinopathy  Last eye exam: Completed never  Macrovascular complications:    Denies: CAD, PVD, CVA   PAST HISTORY: Past Medical History:  Past Medical History:  Diagnosis Date  . Diabetes mellitus without complication (HCC)   . Hypertension      Past Surgical History: No past surgical history on file.   Social History:  reports that she has never smoked. She has never used smokeless tobacco. She reports that she does not drink alcohol or use drugs. Family History:  Family History  Problem Relation Age of Onset  . Hypertension Mother   . Diabetes Mother        HOME MEDICATIONS: Allergies as of 09/13/2018   No Known Allergies     Medication List       Accurate as of September 13, 2018 11:04 AM. If you have any questions, ask your nurse or doctor.        atorvastatin 20 MG tablet Commonly known as: LIPITOR Take 1 tablet (20 mg total) by mouth daily.   chlorthalidone 25 MG tablet Commonly known as: HYGROTON Take 1 tablet (25 mg total) by mouth daily.   glipiZIDE 5 MG tablet Commonly known as: GLUCOTROL Take 1 tablet (5 mg total) by mouth daily before breakfast.   losartan 100 MG tablet Commonly known as: COZAAR Take 1 tablet (100 mg total) by mouth daily.   metFORMIN 500 MG tablet Commonly known as: GLUCOPHAGE 2 tab po bid        ALLERGIES: No Known Allergies   REVIEW OF SYSTEMS: A comprehensive ROS was conducted with the patient and is negative except as per HPI and below:  Review of Systems  Constitutional: Negative for chills and fever.  HENT: Negative for congestion and sore throat.   Eyes: Negative for blurred vision and pain.  Respiratory: Negative for cough and shortness of breath.   Cardiovascular: Negative for chest pain and palpitations.  Gastrointestinal: Negative for diarrhea and nausea.  Genitourinary: Positive for frequency.  Musculoskeletal: Positive for myalgias.  Skin: Negative.   Neurological: Positive for tingling.  Both feet   Endo/Heme/Allergies: Positive for polydipsia.  Psychiatric/Behavioral: Negative for depression. The patient is not nervous/anxious.       OBJECTIVE:   VITAL SIGNS: BP (!) 148/100 (BP Location: Left Arm, Patient Position: Sitting, Cuff Size: Normal)   Pulse 97   Temp 98.2 F (36.8 C)   Ht 5\' 6"  (1.676 m)   Wt 226 lb 6.4 oz (102.7 kg)   LMP 08/21/2018   SpO2 99%   BMI 36.54 kg/m    PHYSICAL EXAM:  General: Pt appears well and is in NAD  Hydration: Well-hydrated with moist mucous membranes and good skin turgor  HEENT: Head: Unremarkable with good dentition.  Oropharynx clear without exudate.  Eyes: External eye exam normal without stare, lid lag or exophthalmos.  EOM intact.  PERRL.  Neck: General: Supple without adenopathy or carotid bruits. Thyroid: Thyroid size normal.  No goiter or nodules appreciated. No thyroid bruit.  Lungs: Clear with good BS bilat with no rales, rhonchi, or wheezes  Heart: RRR with normal S1 and S2 and no gallops; no murmurs; no rub  Abdomen: Normoactive bowel sounds, soft, nontender, without masses or organomegaly palpable  Extremities:  Lower extremities - No pretibial edema. No lesions.  Skin: Normal texture and temperature to palpation. No rash noted. No Acanthosis nigricans/skin tags. No lipohypertrophy.  Neuro: MS is good with appropriate affect, pt is alert and Ox3    DM foot exam: 09/13/2018  The skin of the feet is intact without sores or ulcerations. The pedal pulses are 2+ on right and 2+ on left. The sensation is intact to a screening 5.07, 10 gram monofilament bilaterally   DATA REVIEWED:  Lab Results  Component Value Date   HGBA1C 12.2 (H) 08/21/2018   HGBA1C 12.3 (H) 02/11/2018   Lab Results  Component Value Date   LDLCALC 111 (H) 08/21/2018   CREATININE 1.25 (H) 08/21/2018     Lab Results  Component Value Date   CHOL 167 08/21/2018   HDL 29.90 (L) 08/21/2018   LDLCALC 111 (H) 08/21/2018   TRIG 132.0 08/21/2018   CHOLHDL 6 08/21/2018       In- Office BG 297 ASSESSMENT / PLAN / RECOMMENDATIONS:   1) Type 2 Diabetes Mellitus, Poorly controlled, With Neuropathic complications - Most recent A1c of 12.2 %. Goal A1c < 7.0 %.    Plan: GENERAL:  Poorly controlled diabetes due to poor medication adherence and dietary indiscretions.  I have discussed with the patient the pathophysiology of diabetes. We went over the natural progression of the disease. We talked about both insulin resistance and insulin deficiency. We stressed the importance of lifestyle changes including diet and exercise. I  explained the complications associated with diabetes including retinopathy, nephropathy, neuropathy as well as increased risk of cardiovascular disease. We went over the benefit seen with glycemic control.    I explained to the patient that diabetic patients are at higher than normal risk for amputations. .   I have advised her that insulin would be the quest way to bring her glucose down, but she is terrified of injections.   We discussed the importance of checking glucose at home and the importance of the availability of this data to me   Counseled the pt about regular exercise, avoiding sugar-sweetened beverages and avoiding snacks, discussed low CHO option for snack if necessary.   We discussed risk of hypoglycemia and weight gain with SU and risk of GI side effects with metformin. Pt advised to take it with  food   She was provided with a glucose meter today and shown how to use it   MEDICATIONS: - Glipizide 2 tablets with Breakfast and 2 tablets with Supper - Metformin 500 mg with Breakfast   EDUCATION / INSTRUCTIONS:  BG monitoring instructions: Patient is instructed to check her blood sugars 2 times a day, fasting and supper.  Call [facility] if: BG persistently < 70 or > 300. . I reviewed the Rule of 15 for the treatment of hypoglycemia in detail with the patient. Literature supplied.   2) Diabetic complications:   Eye: Unknown to have known diabetic retinopathy, pt urged to see an opthalmologist  Neuro/ Feet: Does have known diabetic peripheral neuropathy, based on symptoms of tingling and numbness.  Renal: Patient does not have known baseline CKD. She is on an ACEI/ARB at present but her GFR was low for the first time in 08/2018 , will monitor.   3) Lipids: Patient is on a statin but question compliance.   4) Hypertension: This is above goal of < 140/90 mmHg. Pt admits to non-compliance, discussed risk of CVA and CAD with uncontrolled HTN.  5)  Vitamin Deficiency :  - Pt advised to start a MVI daily   F/u in 3 months       Signed electronically by: Mack Guise, MD  [facility]  [facility] [address] [place], [place] [address] Phone: [phone] FAX: [phone]   CC: Elise Benne [address] [place] [address] Phone: [phone]  Fax: [phone]    Return to Endocrinology clinic as below: Future Appointments  Date Time Provider [place]  09/24/2018  9:30 AM [facility] NURSE [facility]  11/21/2018 10:00 AM Saguier, Iris Pert [facility]

## 2018-09-13 NOTE — Addendum Note (Signed)
Addended by: Dorita Sciara on: 09/13/2018 01:17 PM   Modules accepted: Orders

## 2018-09-13 NOTE — Patient Instructions (Addendum)
-   Glipizide 2 tablets with Breakfast and 2 tablets with Supper - Metformin 500 mg with Breakfast    - Check sugar with Breakfast and Supper - Exercise by brisk walking 30  Minutes , 3 times a week  - Choose healthy, lower carb lower calorie snacks: toss salad, cooked vegetables, cottage cheese, peanut butter, low fat cheese / string cheese, lower sodium deli meat, tuna salad or chicken salad     HOW TO TREAT LOW BLOOD SUGARS (Blood sugar LESS THAN 70 MG/DL)  Please follow the RULE OF 15 for the treatment of hypoglycemia treatment (when your (blood sugars are less than 70 mg/dL)    STEP 1: Take 15 grams of carbohydrates when your blood sugar is low, which includes:   3-4 GLUCOSE TABS  OR  3-4 OZ OF JUICE OR REGULAR SODA OR  ONE TUBE OF GLUCOSE GEL     STEP 2: RECHECK blood sugar in 15 MINUTES STEP 3: If your blood sugar is still low at the 15 minute recheck --> then, go back to STEP 1 and treat AGAIN with another 15 grams of carbohydrates.

## 2018-09-13 NOTE — Telephone Encounter (Signed)
Pharmacy called regarding Metformin RX.  Pharmacist needs clarification on directions.  Says that currently the RX came over with 2 different sets of instructions

## 2018-09-24 ENCOUNTER — Ambulatory Visit: Payer: BC Managed Care – PPO

## 2018-11-14 ENCOUNTER — Telehealth: Payer: Self-pay | Admitting: Medical

## 2018-11-14 NOTE — Telephone Encounter (Signed)
LVM on phone and mychart message for pt to reschedule appt from 11-21-2018 for another day since provider will not be in office on 11-21-2018.

## 2018-11-21 ENCOUNTER — Ambulatory Visit: Payer: BC Managed Care – PPO | Admitting: Medical

## 2019-05-26 ENCOUNTER — Other Ambulatory Visit: Payer: Self-pay

## 2019-05-26 ENCOUNTER — Ambulatory Visit: Payer: BC Managed Care – PPO | Admitting: Medical

## 2019-05-26 ENCOUNTER — Telehealth: Payer: Self-pay | Admitting: Medical

## 2019-05-26 VITALS — BP 144/83 | HR 97 | Temp 98.0°F | Resp 18 | Ht 66.0 in | Wt 220.6 lb

## 2019-05-26 DIAGNOSIS — E1169 Type 2 diabetes mellitus with other specified complication: Secondary | ICD-10-CM

## 2019-05-26 DIAGNOSIS — E785 Hyperlipidemia, unspecified: Secondary | ICD-10-CM | POA: Diagnosis not present

## 2019-05-26 DIAGNOSIS — E538 Deficiency of other specified B group vitamins: Secondary | ICD-10-CM

## 2019-05-26 DIAGNOSIS — E519 Thiamine deficiency, unspecified: Secondary | ICD-10-CM

## 2019-05-26 DIAGNOSIS — I10 Essential (primary) hypertension: Secondary | ICD-10-CM | POA: Diagnosis not present

## 2019-05-26 DIAGNOSIS — E1165 Type 2 diabetes mellitus with hyperglycemia: Secondary | ICD-10-CM | POA: Diagnosis not present

## 2019-05-26 LAB — COMPREHENSIVE METABOLIC PANEL
ALT: 8 U/L (ref 0–35)
AST: 8 U/L (ref 0–37)
Albumin: 4.3 g/dL (ref 3.5–5.2)
Alkaline Phosphatase: 105 U/L (ref 39–117)
BUN: 19 mg/dL (ref 6–23)
CO2: 24 mEq/L (ref 19–32)
Calcium: 10.2 mg/dL (ref 8.4–10.5)
Chloride: 94 mEq/L — ABNORMAL LOW (ref 96–112)
Creatinine, Ser: 0.9 mg/dL (ref 0.40–1.20)
GFR: 81.63 mL/min (ref >60.00)
Glucose, Bld: 384 mg/dL — ABNORMAL HIGH (ref 70–99)
Potassium: 4.4 mEq/L (ref 3.5–5.1)
Sodium: 130 mEq/L — ABNORMAL LOW (ref 135–145)
Total Bilirubin: 0.4 mg/dL (ref 0.2–1.2)
Total Protein: 7.9 g/dL (ref 6.0–8.3)

## 2019-05-26 LAB — LIPID PANEL
Cholesterol: 187 mg/dL (ref 0–200)
HDL: 34.5 mg/dL — ABNORMAL LOW (ref >39.00)
LDL Cholesterol: 123 mg/dL — ABNORMAL HIGH (ref 0–99)
NonHDL: 152.94
Total CHOL/HDL Ratio: 5
Triglycerides: 148 mg/dL (ref 0.0–149.0)
VLDL: 29.6 mg/dL (ref 0.0–40.0)

## 2019-05-26 LAB — VITAMIN B12: Vitamin B-12: >1500 pg/mL — ABNORMAL HIGH (ref 211–911)

## 2019-05-26 LAB — HEMOGLOBIN A1C: Hgb A1c MFr Bld: 14.6 % — ABNORMAL HIGH (ref 4.6–6.5)

## 2019-05-26 MED ORDER — CYANOCOBALAMIN 1000 MCG/ML IJ SOLN
1000.0000 ug | Freq: Once | INTRAMUSCULAR | Status: AC
  Administered 2019-05-26: 1000 ug via INTRAMUSCULAR

## 2019-05-26 MED ORDER — METFORMIN HCL 500 MG PO TABS: 500.0000 mg | ORAL_TABLET | Freq: Every day | ORAL | 3 refills | Status: DC

## 2019-05-26 MED ORDER — GLIPIZIDE 5 MG PO TABS: 10.0000 mg | ORAL_TABLET | Freq: Two times a day (BID) | ORAL | 3 refills | Status: DC

## 2019-05-26 MED ORDER — AMLODIPINE BESYLATE 2.5 MG PO TABS: 2.5000 mg | ORAL_TABLET | Freq: Every day | ORAL | 3 refills | Status: DC

## 2019-05-26 NOTE — Progress Notes (Signed)
Subjective:    Patient ID: Adrienne Thompson, female    DOB: [birth year], 44 y.o.   MRN: [id number]  HPI  Pt in for follow up.  She has htn, high cholesterol and diabetes.  Pt states is taking blood pressure medications as she should. She never got bp cuff at home to check as requested.  She states she is not taking metformin for diabetes. States has not been taking that for few months. Also not taking her glipizide. She sees endocrinologist.   I had sent pt my chart message advising her on her 10 year calculated cardiovascular risk.   In the past had atorvastatin rx'd but she has not been on her  Atorvastatin.   The 10-year ASCVD risk score Mikey Bussing DC Brooke Bonito., et al., 2013) is: 23.1%   Values used to calculate the score:     Age: 73 years     Sex: Female     Is Non-Hispanic African American: Yes     Diabetic: Yes     Tobacco smoker: No     Systolic Blood Pressure: 761 mmHg     Is BP treated: Yes     HDL Cholesterol: 29.9 mg/dL     Total Cholesterol: 167 mg/dL      Review of Systems  Constitutional: Negative for chills, fatigue and fever.  Respiratory: Negative for cough, chest tightness, shortness of breath and wheezing.   Cardiovascular: Negative for chest pain and palpitations.  Gastrointestinal: Negative for abdominal pain.  Musculoskeletal: Negative for back pain.  Skin: Negative for rash.  Neurological: Negative for dizziness, light-headedness and numbness.  Hematological: Negative for adenopathy. Does not bruise/bleed easily.  Psychiatric/Behavioral: Negative for behavioral problems, decreased concentration and dysphoric mood.       Objective:   Physical Exam  General Mental Status- Alert. General Appearance- Not in acute distress.   Skin General: Color- Normal Color. Moisture- Normal Moisture.  Neck Carotid Arteries- Normal color. Moisture- Normal Moisture. No carotid bruits. No JVD.  Chest and Lung Exam Auscultation: Breath  Sounds:-Normal.  Cardiovascular Auscultation:Rythm- Regular. Murmurs & Other Heart Sounds:Auscultation of the heart reveals- No Murmurs.  Abdomen Inspection:-Inspeection Normal. Palpation/Percussion:Note:No mass. Palpation and Percussion of the abdomen reveal- Non Tender, Non Distended + BS, no rebound or guarding.    Neurologic Cranial Nerve exam:- CN III-XII intact(No nystagmus), symmetric smile. Drift Test:- No drift. Romberg Exam:- Negative.  Heal to Toe Gait exam:-Normal. Finger to Nose:- Normal/Intact Strength:- 5/5 equal and symmetric strength both upper and lower extremities.      Assessment & Plan:  Your bp is little high today.  I want your blood pressure be closer to 130/80 last than that if possible.  Continue current blood pressure medications and will add amlodipine 2.5 mg to your current regimen.  You had high A1c in the past and has not been on diabetic medications.  Looks like he also missed follow-up appointment with endocrinologist.  Will get CMP and A1c today.  Then likely put you back on your current medications and notify endocrinologist of A1c values.  Hopefully you will be able to follow-up with endocrinologist relatively soon.  For high cholesterol, will check your lipid panel and put you back on statin.  We went over your cardiovascular risk for and I stressed importance of compliance to reduce risk of cardiovascular event.  For low B12 and B1 will get those levels today.  We will get B12 injection today.  Follow-up in 3 weeks to recheck blood pressure or as needed.  Time spent with patient today was 35  minutes which consisted of chart review, discussing diagnosis, work up treatment and documentation.  Esperanza Richters, PA-C

## 2019-05-26 NOTE — Patient Instructions (Addendum)
Your bp is little high today.  I want your blood pressure be closer to 130/80 last than that if possible.  Continue current blood pressure medications and will add amlodipine 2.5 mg to your current regimen.  You had high A1c in the past and has not been on diabetic medications.  Looks like he also missed follow-up appointment with endocrinologist.  Will get CMP and A1c today.  Then likely put you back on your current medications and notify endocrinologist of A1c values.  Hopefully you will be able to follow-up with endocrinologist relatively soon.  For high cholesterol, will check your lipid panel and put you back on statin.  We went over your cardiovascular risk for and I stressed importance of compliance to reduce risk of cardiovascular event.  For low B12 and B1 will get those levels today.  We will get B12 injection today.  Follow-up in 3 weeks to recheck blood pressure or as needed.

## 2019-05-27 NOTE — Telephone Encounter (Signed)
Refilled pt current diabetic meds which she was not taking.

## 2019-05-30 LAB — VITAMIN B1: Vitamin B1 (Thiamine): 7 nmol/L — ABNORMAL LOW (ref 8–30)

## 2019-06-17 ENCOUNTER — Encounter: Payer: Self-pay | Admitting: Medical

## 2019-06-17 ENCOUNTER — Other Ambulatory Visit: Payer: Self-pay

## 2019-06-17 ENCOUNTER — Ambulatory Visit: Payer: BC Managed Care – PPO | Admitting: Medical

## 2019-06-17 VITALS — BP 130/80 | HR 102 | Temp 97.9°F | Resp 18 | Ht 66.0 in | Wt 211.6 lb

## 2019-06-17 DIAGNOSIS — I10 Essential (primary) hypertension: Secondary | ICD-10-CM

## 2019-06-17 MED ORDER — ATORVASTATIN CALCIUM 20 MG PO TABS: 20.0000 mg | ORAL_TABLET | Freq: Every day | ORAL | 3 refills | Status: DC

## 2019-06-17 NOTE — Patient Instructions (Addendum)
Your bp is better today on recheck. bp reading when I checked 120/80 and 130/80. Appears amlodipine did help. Continue other bp meds the same. Recommend get bp cuff over the counter and check bp as insructed/discussed. Check when relaxed and can sometimes check 3 consecutive times with 5 minutes rest between readings.  For diabetes please call Dr. Lonzo Cloud office as you are estalished and need to get back in with her. Number is [phone].  Refilled your atorvastatin for cardiovascular risk reduction.  Follow up 3 months or as needed

## 2019-06-17 NOTE — Progress Notes (Signed)
Subjective:    Patient ID: Adrienne Thompson, female    DOB: Sep 02, 1975, 44 y.o.   MRN: [id number]  HPI  Pt in for bp follow up. Pt does not have bp cuff at home and has not checked her bp.  Added amlodipine 2.5 mg to regimen. Pt is still on chorthalidone and losartan.  No cardiac or neurologic signs or symptoms.  Pt is diabetic. Pt has not followed up with endocrinologist yet. Pt has restarted diabetic meds. I sent endocronologist note on pt high a1c. Pt advised to go ahead and call Dr. Kelton Pillar office.    Review of Systems  Constitutional: Negative for chills, fatigue and fever.  Respiratory: Negative for cough, chest tightness and shortness of breath.   Cardiovascular: Negative for chest pain and palpitations.  Gastrointestinal: Negative for abdominal pain.  Musculoskeletal: Negative for back pain.  Skin: Negative for rash.  Neurological: Negative for dizziness and headaches.  Hematological: Negative for adenopathy. Does not bruise/bleed easily.  Psychiatric/Behavioral: Negative for behavioral problems and confusion.    Past Medical History:  Diagnosis Date  . Diabetes mellitus without complication ([place])   . Dyslipidemia   . Hypertension      Social History   Socioeconomic History  . Marital status: Single    Spouse name: Not on file  . Number of children: Not on file  . Years of education: Not on file  . Highest education level: Not on file  Occupational History  . Not on file  Tobacco Use  . Smoking status: Never Smoker  . Smokeless tobacco: Never Used  Substance and Sexual Activity  . Alcohol use: Never  . Drug use: Never  . Sexual activity: Not on file  Other Topics Concern  . Not on file  Social History Narrative  . Not on file   Social Determinants of Health   Financial Resource Strain:   . Difficulty of Paying Living Expenses:   Food Insecurity:   . Worried About Charity fundraiser in the Last Year:   . Arboriculturist in the Last Year:     Transportation Needs:   . Film/video editor (Medical):   Marland Kitchen Lack of Transportation (Non-Medical):   Physical Activity:   . Days of Exercise per Week:   . Minutes of Exercise per Session:   Stress:   . Feeling of Stress :   Social Connections:   . Frequency of Communication with Friends and Family:   . Frequency of Social Gatherings with Friends and Family:   . Attends Religious Services:   . Active Member of Clubs or Organizations:   . Attends Archivist Meetings:   Marland Kitchen Marital Status:   Intimate Partner Violence:   . Fear of Current or Ex-Partner:   . Emotionally Abused:   Marland Kitchen Physically Abused:   . Sexually Abused:     No past surgical history on file.  Family History  Problem Relation Age of Onset  . Hypertension Mother   . Diabetes Mother     No Known Allergies  Current Outpatient Medications on File Prior to Visit  Medication Sig Dispense Refill  . amLODipine (NORVASC) 2.5 MG tablet Take 1 tablet (2.5 mg total) by mouth daily. 30 tablet 3  . atorvastatin (LIPITOR) 20 MG tablet Take 1 tablet (20 mg total) by mouth daily. 30 tablet 3  . chlorthalidone (HYGROTON) 25 MG tablet Take 1 tablet (25 mg total) by mouth daily. 30 tablet 3  . glipiZIDE (GLUCOTROL) 5  MG tablet Take 2 tablets (10 mg total) by mouth 2 (two) times daily with a meal. 120 tablet 3  . losartan (COZAAR) 100 MG tablet Take 1 tablet (100 mg total) by mouth daily. 30 tablet 3  . metFORMIN (GLUCOPHAGE) 500 MG tablet Take 1 tablet (500 mg total) by mouth daily with breakfast. 30 tablet 3   No current facility-administered medications on file prior to visit.    BP 130/80   Pulse (!) 102   Temp 97.9 F (36.6 C) (Temporal)   Resp 18   Ht 5\' 6"  (1.676 m)   Wt 211 lb 9.6 oz (96 kg)   SpO2 98%   BMI 34.15 kg/m       Objective:   Physical Exam   General Mental Status- Alert. General Appearance- Not in acute distress.   Skin General: Color- Normal Color. Moisture- Normal  Moisture.  Neck Carotid Arteries- Normal color. Moisture- Normal Moisture. No carotid bruits. No JVD.  Chest and Lung Exam Auscultation: Breath Sounds:-Normal.  Cardiovascular Auscultation:Rythm- Regular. Murmurs & Other Heart Sounds:Auscultation of the heart reveals- No Murmurs.   Neurologic Cranial Nerve exam:- CN III-XII intact(No nystagmus), symmetric smile. Strength:- 5/5 equal and symmetric strength both upper and lower extremities.     Assessment & Plan:  Your bp is better today on recheck. bp reading when I checked 120/80 and 130/80. Appears amlodipine did help. Continue other bp meds the same. Recommend get bp cuff over the counter and check bp as insructed/discussed. Check when relaxed and can sometimes check 3 consecutive times with 5 minutes rest between readings.  For diabetes please call Dr. office as you are estalished and need to get back in with her. Number is [phone].  Refilled your atorvastatin for cardiovascular risk reduction.  Follow up 3 months or as needed  Time spent with patient today was  25 minutes which consisted of chart revdiew, discussing diagnosis, work up treatment and documentation.  [phone], PA-C

## 2019-09-16 ENCOUNTER — Other Ambulatory Visit (HOSPITAL_BASED_OUTPATIENT_CLINIC_OR_DEPARTMENT_OTHER): Payer: Self-pay | Admitting: Medical

## 2019-09-16 ENCOUNTER — Other Ambulatory Visit: Payer: Self-pay

## 2019-09-16 ENCOUNTER — Ambulatory Visit: Payer: BC Managed Care – PPO | Admitting: Medical

## 2019-09-16 VITALS — BP 150/90 | HR 106 | Temp 98.2°F | Resp 18 | Ht 66.0 in | Wt 225.0 lb

## 2019-09-16 DIAGNOSIS — E1165 Type 2 diabetes mellitus with hyperglycemia: Secondary | ICD-10-CM

## 2019-09-16 DIAGNOSIS — E1169 Type 2 diabetes mellitus with other specified complication: Secondary | ICD-10-CM | POA: Diagnosis not present

## 2019-09-16 DIAGNOSIS — Z1231 Encounter for screening mammogram for malignant neoplasm of breast: Secondary | ICD-10-CM

## 2019-09-16 DIAGNOSIS — E785 Hyperlipidemia, unspecified: Secondary | ICD-10-CM | POA: Diagnosis not present

## 2019-09-16 DIAGNOSIS — Z124 Encounter for screening for malignant neoplasm of cervix: Secondary | ICD-10-CM

## 2019-09-16 DIAGNOSIS — Z113 Encounter for screening for infections with a predominantly sexual mode of transmission: Secondary | ICD-10-CM

## 2019-09-16 DIAGNOSIS — I10 Essential (primary) hypertension: Secondary | ICD-10-CM

## 2019-09-16 DIAGNOSIS — E119 Type 2 diabetes mellitus without complications: Secondary | ICD-10-CM

## 2019-09-16 DIAGNOSIS — Z0100 Encounter for examination of eyes and vision without abnormal findings: Secondary | ICD-10-CM

## 2019-09-16 LAB — COMPREHENSIVE METABOLIC PANEL
ALT: 9 U/L (ref 0–35)
AST: 9 U/L (ref 0–37)
Albumin: 4.1 g/dL (ref 3.5–5.2)
Alkaline Phosphatase: 109 U/L (ref 39–117)
BUN: 11 mg/dL (ref 6–23)
CO2: 24 mEq/L (ref 19–32)
Calcium: 9.5 mg/dL (ref 8.4–10.5)
Chloride: 99 mEq/L (ref 96–112)
Creatinine, Ser: 0.74 mg/dL (ref 0.40–1.20)
GFR: 102.18 mL/min (ref >60.00)
Glucose, Bld: 322 mg/dL — ABNORMAL HIGH (ref 70–99)
Potassium: 4.1 mEq/L (ref 3.5–5.1)
Sodium: 132 mEq/L — ABNORMAL LOW (ref 135–145)
Total Bilirubin: 0.3 mg/dL (ref 0.2–1.2)
Total Protein: 7.5 g/dL (ref 6.0–8.3)

## 2019-09-16 LAB — LIPID PANEL
Cholesterol: 178 mg/dL (ref 0–200)
HDL: 36.3 mg/dL — ABNORMAL LOW (ref >39.00)
LDL Cholesterol: 116 mg/dL — ABNORMAL HIGH (ref 0–99)
NonHDL: 141.35
Total CHOL/HDL Ratio: 5
Triglycerides: 125 mg/dL (ref 0.0–149.0)
VLDL: 25 mg/dL (ref 0.0–40.0)

## 2019-09-16 LAB — HEMOGLOBIN A1C: Hgb A1c MFr Bld: 12.6 % — ABNORMAL HIGH (ref 4.6–6.5)

## 2019-09-16 MED ORDER — AMLODIPINE BESYLATE 5 MG PO TABS
5.0000 mg | ORAL_TABLET | Freq: Every day | ORAL | 3 refills | Status: DC
Start: 2019-09-16 — End: 2020-08-11

## 2019-09-16 NOTE — Progress Notes (Signed)
Subjective:    Patient ID: Adrienne Thompson, female    DOB: 01-Feb-1976, 44 y.o.   MRN: [id number]  HPI  Pt has htn. Pt last check at home was 140 systolic but can't remember diastolic. No cardiac or neurologic sign or symptoms reported.   Pt has upper arm bp cuff at home.  Pt has diabetes. Last visit  On last visit I had given Dr. Lonzo Cloud number to go ahead and try to get established. But since her office was to far she indicates did not call. Discussed with pt today. Dr. Lonzo Cloud now in our office 2 days a week. Pt has been on metformin.  Hx of high cholesterol. She is on atorvastatin.  Pt states has been years/ a while since last pap smear.   Pt has not gotten covid vaccine. She is still thinking about it.   Review of Systems  Constitutional: Negative for chills, fatigue and fever.  Respiratory: Negative for cough, chest tightness, shortness of breath and wheezing.   Cardiovascular: Negative for chest pain and palpitations.  Gastrointestinal: Negative for abdominal pain, constipation, nausea and vomiting.  Musculoskeletal: Negative for back pain.  Skin: Negative for rash.  Neurological: Negative for dizziness and light-headedness.  Hematological: Negative for adenopathy. Does not bruise/bleed easily.  Psychiatric/Behavioral: Negative for behavioral problems, confusion and sleep disturbance. The patient is not nervous/anxious.     Past Medical History:  Diagnosis Date  . Diabetes mellitus without complication ([facility])   . Dyslipidemia   . Hypertension      Social History   Socioeconomic History  . Marital status: Single    Spouse name: Not on file  . Number of children: Not on file  . Years of education: Not on file  . Highest education level: Not on file  Occupational History  . Not on file  Tobacco Use  . Smoking status: Never Smoker  . Smokeless tobacco: Never Used  Substance and Sexual Activity  . Alcohol use: Never  . Drug use: Never  . Sexual activity:  Not on file  Other Topics Concern  . Not on file  Social History Narrative  . Not on file   Social Determinants of Health   Financial Resource Strain:   . Difficulty of Paying Living Expenses:   Food Insecurity:   . Worried About Programme researcher, broadcasting/film/video in the Last Year:   . Barista in the Last Year:   Transportation Needs:   . Freight forwarder (Medical):   Marland Kitchen Lack of Transportation (Non-Medical):   Physical Activity:   . Days of Exercise per Week:   . Minutes of Exercise per Session:   Stress:   . Feeling of Stress :   Social Connections:   . Frequency of Communication with Friends and Family:   . Frequency of Social Gatherings with Friends and Family:   . Attends Religious Services:   . Active Member of Clubs or Organizations:   . Attends Banker Meetings:   Marland Kitchen Marital Status:   Intimate Partner Violence:   . Fear of Current or Ex-Partner:   . Emotionally Abused:   Marland Kitchen Physically Abused:   . Sexually Abused:     No past surgical history on file.  Family History  Problem Relation Age of Onset  . Hypertension Mother   . Diabetes Mother     No Known Allergies  Current Outpatient Medications on File Prior to Visit  Medication Sig Dispense Refill  . amLODipine (NORVASC) 2.5  MG tablet Take 1 tablet (2.5 mg total) by mouth daily. 30 tablet 3  . atorvastatin (LIPITOR) 20 MG tablet Take 1 tablet (20 mg total) by mouth daily. 90 tablet 3  . chlorthalidone (HYGROTON) 25 MG tablet Take 1 tablet (25 mg total) by mouth daily. 30 tablet 3  . glipiZIDE (GLUCOTROL) 5 MG tablet Take 2 tablets (10 mg total) by mouth 2 (two) times daily with a meal. 120 tablet 3  . losartan (COZAAR) 100 MG tablet Take 1 tablet (100 mg total) by mouth daily. 30 tablet 3  . metFORMIN (GLUCOPHAGE) 500 MG tablet Take 1 tablet (500 mg total) by mouth daily with breakfast. 30 tablet 3   No current facility-administered medications on file prior to visit.    BP (!) 150/90 (BP  Location: Left Arm, Patient Position: Sitting, Cuff Size: Large)   Pulse (!) 106   Temp 98.2 F (36.8 C) (Oral)   Resp 18   Ht 5\' 6"  (1.676 m)   Wt (!) 225 lb (102.1 kg)   LMP 08/22/2019   SpO2 98%   BMI 36.32 kg/m        Objective:   Physical Exam  General Mental Status- Alert. General Appearance- Not in acute distress.   Skin General: Color- Normal Color. Moisture- Normal Moisture.  Neck Carotid Arteries- Normal color. Moisture- Normal Moisture. No carotid bruits. No JVD.  Chest and Lung Exam Auscultation: Breath Sounds:-Normal.  Cardiovascular Auscultation:Rythm- Regular. Murmurs & Other Heart Sounds:Auscultation of the heart reveals- No Murmurs.  Abdomen Inspection:-Inspeection Normal. Palpation/Percussion:Note:No mass. Palpation and Percussion of the abdomen reveal- Non Tender, Non Distended + BS, no rebound or guarding.    Neurologic Cranial Nerve exam:- CN III-XII intact(No nystagmus), symmetric smile. Strength:- 5/5 equal and symmetric strength both upper and lower extremities.      Assessment & Plan:  You have history of diabetes with very high A1c in the past.  We will get a metabolic panel today, A1c and urine microalbumin study.  Continue current or medication.  Do want you to go ahead and schedule with Dr. 10/23/2019 here locally.  I see that you had appointment with her last summer.  You indicate that there was a long commute to that office.  So quick continue appointment here at the med center.  Please get scheduled on the way out to see her.  For hyperlipidemia, will get lipid panel today and might adjust medication if necessary.  Your blood pressure is elevated today.  Ideally would like to see blood pressure closer to 130/80.  Continue chlorthalidone, losartan and increasing amlodipine to 5 mg daily.  Place referral for diabetic eye exam.  Place referral for screening Pap smear with gynecologist.  You can go to their office down the hall and  get scheduled.  Including HIV screening with labs today.   Follow-up date to be determined after lab review.  Please consider getting COVID vaccine in light of your risk factors.  Time spent with patient today was   12-17-1975 which consisted of chart rediew, discussing diagnoses, work up, treatment and documentation.

## 2019-09-16 NOTE — Patient Instructions (Signed)
You have history of diabetes with very high A1c in the past.  We will get a metabolic panel today, A1c and urine microalbumin study.  Continue current or medication.  Do want you to go ahead and schedule with Dr. Lonzo Cloud here locally.  I see that you had appointment with her last summer.  You indicate that there was a long commute to that office.  So quick continue appointment here at the med center.  Please get scheduled on the way out to see her.  For hyperlipidemia, will get lipid panel today and might adjust medication if necessary.  Your blood pressure is elevated today.  Ideally would like to see blood pressure closer to 130/80.  Continue chlorthalidone, losartan and increasing amlodipine to 5 mg daily.  Place referral for diabetic eye exam.  Place referral for screening Pap smear with gynecologist.  You can go to their office down the hall and get scheduled.  Including HIV screening with labs today.   Follow-up date to be determined after lab review.  Please consider getting COVID vaccine in light of your risk factors.

## 2019-09-17 LAB — MICROALBUMIN, URINE: Microalb, Ur: 4.6 mg/dL

## 2019-09-17 LAB — HIV ANTIBODY (ROUTINE TESTING W REFLEX): HIV 1&2 Ab, 4th Generation: NONREACTIVE

## 2019-09-23 ENCOUNTER — Ambulatory Visit: Payer: BC Managed Care – PPO | Admitting: Internal Medicine

## 2019-09-23 ENCOUNTER — Other Ambulatory Visit: Payer: Self-pay

## 2019-09-23 ENCOUNTER — Encounter: Payer: Self-pay | Admitting: Internal Medicine

## 2019-09-23 ENCOUNTER — Ambulatory Visit (HOSPITAL_BASED_OUTPATIENT_CLINIC_OR_DEPARTMENT_OTHER)
Admission: RE | Admit: 2019-09-23 | Discharge: 2019-09-23 | Disposition: A | Discharge status: Home / Self Care | Payer: BC Managed Care – PPO | Source: Ambulatory Visit | Attending: Medical | Admitting: Medical

## 2019-09-23 ENCOUNTER — Encounter (HOSPITAL_BASED_OUTPATIENT_CLINIC_OR_DEPARTMENT_OTHER): Payer: Self-pay

## 2019-09-23 DIAGNOSIS — Z1231 Encounter for screening mammogram for malignant neoplasm of breast: Secondary | ICD-10-CM | POA: Diagnosis not present

## 2019-09-23 NOTE — Progress Notes (Deleted)
Name: Adrienne Thompson  MRN/ DOB: [id number], 1975-09-03   Age/ Sex: 44 y.o., female    PCP: Marisue Brooklyn   Reason for Endocrinology Evaluation: Type 2 Diabetes Mellitus     Date of Initial Endocrinology Visit: 09/23/2019     PATIENT IDENTIFIER: [name] is a 44 y.o. female with a past medical history of HTN, T2DM and Dyslipidemia. The patient presented for initial endocrinology clinic visit on 09/23/2019 for consultative assistance with her diabetes management.    HPI: [name] was    Diagnosed with DM *** Prior Medications tried/Intolerance: *** Currently checking blood sugars *** x / day,  before breakfast and ***.  Hypoglycemia episodes : ***               Symptoms: ***                 Frequency: ***/  Hemoglobin A1c has ranged from 12.2%in 2020, peaking at 14.6% in 2021. Patient required assistance for hypoglycemia:  Patient has required hospitalization within the last 1 year from hyper or hypoglycemia:   In terms of diet, the patient ***   HOME DIABETES REGIMEN: Metformin 500 mg  Glipizide 5 mg    Statin: yes ACE-I/ARB: yes Prior Diabetic Education: {Yes/No:11203}   METER DOWNLOAD SUMMARY: Date range evaluated: *** Fingerstick Blood Glucose Tests = *** Average Number Tests/Day = *** Overall Mean FS Glucose = *** Standard Deviation = ***  BG Ranges: Low = *** High = ***   Hypoglycemic Events/30 Days: BG < 50 = *** Episodes of symptomatic severe hypoglycemia = ***   DIABETIC COMPLICATIONS: Microvascular complications:   Neuropathy   Denies: CKD  Last eye exam: Completed   Macrovascular complications:   ***  Denies: CAD, PVD, CVA   PAST HISTORY: Past Medical History:  Past Medical History:  Diagnosis Date  . Diabetes mellitus without complication ([facility])   . Dyslipidemia   . Hypertension      Past Surgical History: No past surgical history on file.   Social History:  reports that she has never smoked. She has never used  smokeless tobacco. She reports that she does not drink alcohol and does not use drugs. Family History:  Family History  Problem Relation Age of Onset  . Hypertension Mother   . Diabetes Mother       HOME MEDICATIONS: Allergies as of 09/23/2019   No Known Allergies     Medication List       Accurate as of September 23, 2019  8:14 AM. If you have any questions, ask your nurse or doctor.        amLODipine 5 MG tablet Commonly known as: NORVASC Take 1 tablet (5 mg total) by mouth daily.   atorvastatin 20 MG tablet Commonly known as: LIPITOR Take 1 tablet (20 mg total) by mouth daily.   chlorthalidone 25 MG tablet Commonly known as: HYGROTON Take 1 tablet (25 mg total) by mouth daily.   glipiZIDE 5 MG tablet Commonly known as: GLUCOTROL Take 2 tablets (10 mg total) by mouth 2 (two) times daily with a meal.   losartan 100 MG tablet Commonly known as: COZAAR Take 1 tablet (100 mg total) by mouth daily.   metFORMIN 500 MG tablet Commonly known as: GLUCOPHAGE Take 1 tablet (500 mg total) by mouth daily with breakfast.        ALLERGIES: No Known Allergies   REVIEW OF SYSTEMS: A comprehensive ROS was conducted with the patient and is negative except  as per HPI and below:  ROS    OBJECTIVE:   VITAL SIGNS: There were no vitals taken for this visit.   PHYSICAL EXAM:  General: Pt appears well and is in NAD  Hydration: Well-hydrated with moist mucous membranes and good skin turgor  HEENT: Head: Unremarkable with good dentition. Oropharynx clear without exudate.  Eyes: External eye exam normal without stare, lid lag or exophthalmos.  EOM intact.  PERRL.  Neck: General: Supple without adenopathy or carotid bruits. Thyroid: Thyroid size normal.  No goiter or nodules appreciated. No thyroid bruit.  Lungs: Clear with good BS bilat with no rales, rhonchi, or wheezes  Heart: RRR with normal S1 and S2 and no gallops; no murmurs; no rub  Abdomen: Normoactive bowel sounds,  soft, nontender, without masses or organomegaly palpable  Extremities:  Lower extremities - No pretibial edema. No lesions.  Skin: Normal texture and temperature to palpation. No rash noted. No Acanthosis nigricans/skin tags. No lipohypertrophy.  Neuro: MS is good with appropriate affect, pt is alert and Ox3    DM foot exam:    DATA REVIEWED:  Lab Results  Component Value Date   HGBA1C 12.6 (H) 09/16/2019   HGBA1C 14.6 (H) 05/26/2019   HGBA1C 12.2 (H) 08/21/2018   Lab Results  Component Value Date   MICROALBUR 4.6 09/16/2019   LDLCALC 116 (H) 09/16/2019   CREATININE 0.74 09/16/2019   No results found for: [facility]  Lab Results  Component Value Date   CHOL 178 09/16/2019   HDL 36.30 (L) 09/16/2019   LDLCALC 116 (H) 09/16/2019   TRIG 125.0 09/16/2019   CHOLHDL 5 09/16/2019        ASSESSMENT / PLAN / RECOMMENDATIONS:   1) Type 2 Diabetes Mellitus, Poorly controlled, With*** complications - Most recent A1c of *** %. Goal A1c < 7.0 %.    Plan: GENERAL:  ***  MEDICATIONS:  ***  EDUCATION / INSTRUCTIONS:  BG monitoring instructions: Patient is instructed to check her blood sugars *** times a day, ***.  Call [facility] if: BG persistently < 70 or > 300. . I reviewed the Rule of 15 for the treatment of hypoglycemia in detail with the patient. Literature supplied.   2) Diabetic complications:   Eye: Does *** have known diabetic retinopathy.   Neuro/ Feet: Does have known diabetic peripheral neuropathy.  Renal: Patient does not have known baseline CKD. She is  on an ACEI/ARB at present.   3) Dyslipidemia: Patient is on atorvastatin, LDL above goal.   4) Hypertension: ***  at goal of < 140/90 mmHg.       Signed electronically by: Lyndle Herrlich, MD  [facility]  [facility] [address] [place] [place], [place] [address] Phone: [phone] FAX: [phone]   CC: Marisue Brooklyn [address] [place] [address] Phone: [phone]  Fax: [phone]    Return to Endocrinology clinic as below: Future Appointments  Date Time Provider Department Center  09/23/2019  9:10 AM Sergey Ishler, Konrad Dolores, MD [facility]  10/06/2019 10:45 AM Levie Heritage, DO [facility] None  12/18/2019 10:40 AM Saguier, Kateri Mc [facility]

## 2019-10-06 ENCOUNTER — Encounter: Payer: BC Managed Care – PPO | Admitting: Family Medicine

## 2019-12-18 ENCOUNTER — Ambulatory Visit: Payer: BC Managed Care – PPO | Admitting: Medical

## 2019-12-18 ENCOUNTER — Other Ambulatory Visit: Payer: Self-pay

## 2019-12-18 ENCOUNTER — Encounter: Payer: Self-pay | Admitting: Medical

## 2019-12-18 VITALS — BP 184/100 | HR 93 | Temp 98.5°F | Wt 222.4 lb

## 2019-12-18 DIAGNOSIS — E1165 Type 2 diabetes mellitus with hyperglycemia: Secondary | ICD-10-CM

## 2019-12-18 DIAGNOSIS — E785 Hyperlipidemia, unspecified: Secondary | ICD-10-CM

## 2019-12-18 DIAGNOSIS — Z9114 Patient's other noncompliance with medication regimen: Secondary | ICD-10-CM

## 2019-12-18 DIAGNOSIS — Z7185 Encounter for immunization safety counseling: Secondary | ICD-10-CM

## 2019-12-18 DIAGNOSIS — Z23 Encounter for immunization: Secondary | ICD-10-CM

## 2019-12-18 DIAGNOSIS — I10 Essential (primary) hypertension: Secondary | ICD-10-CM | POA: Diagnosis not present

## 2019-12-18 DIAGNOSIS — E1169 Type 2 diabetes mellitus with other specified complication: Secondary | ICD-10-CM | POA: Diagnosis not present

## 2019-12-18 DIAGNOSIS — K047 Periapical abscess without sinus: Secondary | ICD-10-CM

## 2019-12-18 DIAGNOSIS — E538 Deficiency of other specified B group vitamins: Secondary | ICD-10-CM

## 2019-12-18 MED ORDER — LOSARTAN POTASSIUM 100 MG PO TABS: 100.0000 mg | ORAL_TABLET | Freq: Every day | ORAL | 3 refills | Status: DC

## 2019-12-18 MED ORDER — METFORMIN HCL 500 MG PO TABS: 500.0000 mg | ORAL_TABLET | Freq: Every day | ORAL | 3 refills | Status: DC

## 2019-12-18 MED ORDER — TRAMADOL HCL 50 MG PO TABS: 50.0000 mg | ORAL_TABLET | Freq: Four times a day (QID) | ORAL | 0 refills | Status: AC | PRN

## 2019-12-18 MED ORDER — AMOXICILLIN-POT CLAVULANATE 875-125 MG PO TABS: 1.0000 | ORAL_TABLET | Freq: Two times a day (BID) | ORAL | 0 refills | Status: DC

## 2019-12-18 NOTE — Progress Notes (Addendum)
Subjective:    Patient ID: Adrienne Thompson, female    DOB: 28-Dec-1975, 44 y.o.   MRN: [id number]  HPI  Pt in for follow up.   Pt has not taken her bp med today. She admits not compliant./has not been taking it. No cardiac or neurologic signs/symptoms. Pt has rx of amlodipine, losartan and chlorthalidone.  Pt has diabetes. She has not been taking this either.   Pt has high cholesterol.- Has not been on atorvastatin either.   The 10-year ASCVD risk score Denman George DC Montez Hageman., et al., 2013) is: 46.4%   Values used to calculate the score:     Age: 42 years     Sex: Female     Is Non-Hispanic African American: Yes     Diabetic: Yes     Tobacco smoker: No     Systolic Blood Pressure: 184 mmHg     Is BP treated: Yes     HDL Cholesterol: 36.3 mg/dL     Total Cholesterol: 178 mg/dL  Pt has not gotten covid vaccine.  Pt has not gotten flu vaccine. Will get flu vaccine today.  Pt also has tooth top back side. Off and on pain. Pain past 2-3 weeks. Sensitive to cold. Pt has not called dentist.      Review of Systems  Constitutional: Negative for chills and fever.  HENT:       Left upper tooth pain.  Eyes: Negative for photophobia.  Respiratory: Negative for cough, shortness of breath and wheezing.   Cardiovascular: Negative for chest pain, palpitations and leg swelling.  Gastrointestinal: Negative for abdominal pain.  Genitourinary: Negative for flank pain.  Musculoskeletal: Positive for neck pain.  Skin: Negative for rash.  Neurological: Negative for dizziness, tremors, seizures, weakness and headaches.  Psychiatric/Behavioral: Negative for behavioral problems. The patient is not nervous/anxious.    Past Medical History:  Diagnosis Date  . Diabetes mellitus without complication (HCC)   . Dyslipidemia   . Hypertension      Social History   Socioeconomic History  . Marital status: Single    Spouse name: Not on file  . Number of children: Not on file  . Years of education: Not  on file  . Highest education level: Not on file  Occupational History  . Not on file  Tobacco Use  . Smoking status: Never Smoker  . Smokeless tobacco: Never Used  Substance and Sexual Activity  . Alcohol use: Never  . Drug use: Never  . Sexual activity: Not on file  Other Topics Concern  . Not on file  Social History Narrative  . Not on file   Social Determinants of Health   Financial Resource Strain:   . Difficulty of Paying Living Expenses: Not on file  Food Insecurity:   . Worried About Programme researcher, broadcasting/film/video in the Last Year: Not on file  . Ran Out of Food in the Last Year: Not on file  Transportation Needs:   . Lack of Transportation (Medical): Not on file  . Lack of Transportation (Non-Medical): Not on file  Physical Activity:   . Days of Exercise per Week: Not on file  . Minutes of Exercise per Session: Not on file  Stress:   . Feeling of Stress : Not on file  Social Connections:   . Frequency of Communication with Friends and Family: Not on file  . Frequency of Social Gatherings with Friends and Family: Not on file  . Attends Religious Services: Not on file  .  Active Member of Clubs or Organizations: Not on file  . Attends Banker Meetings: Not on file  . Marital Status: Not on file  Intimate Partner Violence:   . Fear of Current or Ex-Partner: Not on file  . Emotionally Abused: Not on file  . Physically Abused: Not on file  . Sexually Abused: Not on file    History reviewed. No pertinent surgical history.  Family History  Problem Relation Age of Onset  . Hypertension Mother   . Diabetes Mother     No Known Allergies  Current Outpatient Medications on File Prior to Visit  Medication Sig Dispense Refill  . amLODipine (NORVASC) 5 MG tablet Take 1 tablet (5 mg total) by mouth daily. 90 tablet 3  . atorvastatin (LIPITOR) 20 MG tablet Take 1 tablet (20 mg total) by mouth daily. 90 tablet 3  . chlorthalidone (HYGROTON) 25 MG tablet Take 1 tablet  (25 mg total) by mouth daily. 30 tablet 3  . glipiZIDE (GLUCOTROL) 5 MG tablet Take 2 tablets (10 mg total) by mouth 2 (two) times daily with a meal. 120 tablet 3   No current facility-administered medications on file prior to visit.    BP (!) 184/100   Pulse 93   Temp 98.5 F (36.9 C) (Oral)   Wt 222 lb 6.4 oz (100.9 kg)   SpO2 99%   BMI 35.90 kg/m      Objective:   Physical Exam  General Mental Status- Alert. General Appearance- Not in acute distress.   Skin General: Color- Normal Color. Moisture- Normal Moisture.  Neck Carotid Arteries- Normal color. Moisture- Normal Moisture. No carotid bruits. No JVD.  Chest and Lung Exam Auscultation: Breath Sounds:-Normal.  Cardiovascular Auscultation:Rythm- Regular. Murmurs & Other Heart Sounds:Auscultation of the heart reveals- No Murmurs.  Abdomen Inspection:-Inspeection Normal. Palpation/Percussion:Note:No mass. Palpation and Percussion of the abdomen reveal- Non Tender, Non Distended + BS, no rebound or guarding.    Neurologic Cranial Nerve exam:- CN III-XII intact(No nystagmus), symmetric smile. Drift Test:- No drift. Romberg Exam:- Negative.  Heal to Toe Gait exam:-Normal. Finger to Nose:- Normal/Intact Strength:- 5/5 equal and symmetric strength both upper and lower extremities.      Assessment & Plan:  Your blood pressure is very elevated today.  Please start back on your amlodipine, losartan and chlorthalidone.  Medication noncompliance is reason for such severe elevation.  Your cholesterol has been elevated in the past and do recommend that you start cholesterol medication statin and eat low-cholesterol diet.  Diabetes with very high A1c in the past.  Please start Metformin and glipizide.  Metformin instructions just 1 tablet daily presently.  But if you do not have any side effects after 2 weeks then want you to increase Metformin to twice daily.  Also placed new referral to diabetic educator.  Please attend  that appointment.  Exercise, diet and medication important in controlling diabetes.  Vaccine counseling today and you agreed to flu vaccine.  Overall with hypertension, high cholesterol and diabetes concerned with medication noncompliance.  Went over your 10-year cardiovascular risk for and it came out calculated at 46%.  Please start meds as do not want you to have heart attack or stroke.  We will get CMP, A1c and lipid panel today.  Recheck B12 due to low B12/deficiency history.  For a toothache/infection I prescribed Augmentin.  For tablet prescription of tramadol sent to your pharmacy.  Use 1 tablet every 6 hours as needed severe pain.  Rx advisement given.  Advised not to use while working.  To go ahead and schedule appointment with dentist.  Follow-up 7 days or as needed

## 2019-12-18 NOTE — Patient Instructions (Addendum)
Your blood pressure is very elevated today.  Please start back on your amlodipine, losartan and chlorthalidone.  Medication noncompliance is reason for such severe elevation.  Your cholesterol has been elevated in the past and do recommend that you start cholesterol medication statin and eat low-cholesterol diet.  Diabetes with very high A1c in the past.  Please start Metformin and glipizide.  Metformin instructions just 1 tablet daily presently.  But if you do not have any side effects after 2 weeks then want you to increase Metformin to twice daily.  Also placed new referral to diabetic educator.  Please attend that appointment.  Exercise, diet and medication important in controlling diabetes.  Vaccine counseling today and you agreed to flu vaccine.  Overall with hypertension, high cholesterol and diabetes concerned with medication noncompliance.  Went over your 10-year cardiovascular risk for and it came out calculated at 46%.  Please start meds as do not want you to have heart attack or stroke.  We will get CMP, A1c and lipid panel today.  Recheck B12 due to low B12/deficiency history.  For a toothache/infection I prescribed Augmentin.  For tablet prescription of tramadol sent to your pharmacy.  Use 1 tablet every 6 hours as needed severe pain.  Rx advisement given.  Advised not to use while working.  To go ahead and schedule appointment with dentist.  Follow-up 7 days or as needed

## 2019-12-19 ENCOUNTER — Telehealth: Payer: Self-pay | Admitting: Medical

## 2019-12-19 LAB — COMPREHENSIVE METABOLIC PANEL
AG Ratio: 1.2 (calc) (ref 1.0–2.5)
ALT: 10 U/L (ref 6–29)
AST: 11 U/L (ref 10–35)
Albumin: 4.3 g/dL (ref 3.6–5.1)
Alkaline phosphatase (APISO): 118 U/L (ref 31–125)
BUN: 11 mg/dL (ref 7–25)
CO2: 23 mmol/L (ref 20–32)
Calcium: 10 mg/dL (ref 8.6–10.2)
Chloride: 98 mmol/L (ref 98–110)
Creat: 0.75 mg/dL (ref 0.50–1.10)
Globulin: 3.6 g/dL (calc) (ref 1.9–3.7)
Glucose, Bld: 302 mg/dL — ABNORMAL HIGH (ref 65–99)
Potassium: 4.3 mmol/L (ref 3.5–5.3)
Sodium: 134 mmol/L — ABNORMAL LOW (ref 135–146)
Total Bilirubin: 0.3 mg/dL (ref 0.2–1.2)
Total Protein: 7.9 g/dL (ref 6.1–8.1)

## 2019-12-19 LAB — HEMOGLOBIN A1C
Hgb A1c MFr Bld: 13.8 % of total Hgb — ABNORMAL HIGH (ref <5.7)
Mean Plasma Glucose: 349 (calc)
eAG (mmol/L): 19.4 (calc)

## 2019-12-19 LAB — LIPID PANEL
Cholesterol: 193 mg/dL (ref <200)
HDL: 37 mg/dL — ABNORMAL LOW (ref >=50)
LDL Cholesterol (Calc): 135 mg/dL (calc) — ABNORMAL HIGH
Non-HDL Cholesterol (Calc): 156 mg/dL (calc) — ABNORMAL HIGH (ref <130)
Total CHOL/HDL Ratio: 5.2 (calc) — ABNORMAL HIGH (ref <5.0)
Triglycerides: 103 mg/dL (ref <150)

## 2019-12-19 LAB — VITAMIN B12: Vitamin B-12: 666 pg/mL (ref 200–1100)

## 2019-12-19 MED ORDER — METFORMIN HCL 500 MG PO TABS: 500.0000 mg | ORAL_TABLET | Freq: Two times a day (BID) | ORAL | 0 refills | Status: DC

## 2019-12-19 NOTE — Telephone Encounter (Signed)
rx metformin sent to pt pharmacy 

## 2019-12-30 ENCOUNTER — Other Ambulatory Visit: Payer: Self-pay

## 2019-12-30 ENCOUNTER — Encounter: Payer: Self-pay | Admitting: Medical

## 2019-12-30 ENCOUNTER — Ambulatory Visit: Payer: BC Managed Care – PPO | Admitting: Medical

## 2019-12-30 VITALS — BP 140/80 | HR 103 | Resp 18 | Ht 66.0 in | Wt 222.0 lb

## 2019-12-30 DIAGNOSIS — K047 Periapical abscess without sinus: Secondary | ICD-10-CM | POA: Diagnosis not present

## 2019-12-30 DIAGNOSIS — E785 Hyperlipidemia, unspecified: Secondary | ICD-10-CM

## 2019-12-30 DIAGNOSIS — E1165 Type 2 diabetes mellitus with hyperglycemia: Secondary | ICD-10-CM

## 2019-12-30 DIAGNOSIS — I10 Essential (primary) hypertension: Secondary | ICD-10-CM | POA: Diagnosis not present

## 2019-12-30 DIAGNOSIS — E1169 Type 2 diabetes mellitus with other specified complication: Secondary | ICD-10-CM

## 2019-12-30 MED ORDER — CHLORTHALIDONE 25 MG PO TABS: 25.0000 mg | ORAL_TABLET | Freq: Every day | ORAL | 3 refills | Status: DC

## 2019-12-30 NOTE — Progress Notes (Signed)
Subjective:    Patient ID: Adrienne Thompson, female    DOB: 1976-01-11, 44 y.o.   MRN: [id number]  HPI  Pt in for bp check. She states since last visit she was taking only losartan and chlorthalidone. Was not taking amlodipine. No cardiac or neurologic side effects.   Pt had toothache/infection. I rx'd augmentin. Her tooth feels better. Appointment to dentist has not been scheduled. Had brief nausea when started antibiotic.   Pt a1c was 13.8. Pt was not taking metformin and glipizide. I put in referral to diabetic educator.   Pt has high cholesterol Admits non compliance on atorvastatin.   The 10-year ASCVD risk score Denman George DC Montez Hageman., et al., 2013) is: 22.8%   Values used to calculate the score:     Age: 69 years     Sex: Female     Is Non-Hispanic African American: Yes     Diabetic: Yes     Tobacco smoker: No     Systolic Blood Pressure: 151 mmHg     Is BP treated: Yes     HDL Cholesterol: 37 mg/dL     Total Cholesterol: 193 mg/dL    Review of Systems  Constitutional: Negative for chills, fatigue and fever.  HENT: Positive for dental problem.        Pain resolved now.  Respiratory: Negative for choking, chest tightness, shortness of breath and wheezing.   Cardiovascular: Negative for chest pain and palpitations.  Gastrointestinal: Negative for abdominal distention, abdominal pain and nausea.       Nausea seem to occur with use of Augmentin early on.  None presently.  Endocrine: Negative for polyphagia.  Genitourinary: Negative for dysuria.  Musculoskeletal: Negative for back pain and myalgias.  Neurological: Negative for dizziness, light-headedness and headaches.  Hematological: Negative for adenopathy. Does not bruise/bleed easily.  Psychiatric/Behavioral: Negative for confusion.   Past Medical History:  Diagnosis Date  . Diabetes mellitus without complication (HCC)   . Dyslipidemia   . Hypertension      Social History   Socioeconomic History  . Marital status:  Single    Spouse name: Not on file  . Number of children: Not on file  . Years of education: Not on file  . Highest education level: Not on file  Occupational History  . Not on file  Tobacco Use  . Smoking status: Never Smoker  . Smokeless tobacco: Never Used  Substance and Sexual Activity  . Alcohol use: Never  . Drug use: Never  . Sexual activity: Not on file  Other Topics Concern  . Not on file  Social History Narrative  . Not on file   Social Determinants of Health   Financial Resource Strain:   . Difficulty of Paying Living Expenses: Not on file  Food Insecurity:   . Worried About Programme researcher, broadcasting/film/video in the Last Year: Not on file  . Ran Out of Food in the Last Year: Not on file  Transportation Needs:   . Lack of Transportation (Medical): Not on file  . Lack of Transportation (Non-Medical): Not on file  Physical Activity:   . Days of Exercise per Week: Not on file  . Minutes of Exercise per Session: Not on file  Stress:   . Feeling of Stress : Not on file  Social Connections:   . Frequency of Communication with Friends and Family: Not on file  . Frequency of Social Gatherings with Friends and Family: Not on file  . Attends Religious Services: Not  on file  . Active Member of Clubs or Organizations: Not on file  . Attends Banker Meetings: Not on file  . Marital Status: Not on file  Intimate Partner Violence:   . Fear of Current or Ex-Partner: Not on file  . Emotionally Abused: Not on file  . Physically Abused: Not on file  . Sexually Abused: Not on file    No past surgical history on file.  Family History  Problem Relation Age of Onset  . Hypertension Mother   . Diabetes Mother     No Known Allergies  Current Outpatient Medications on File Prior to Visit  Medication Sig Dispense Refill  . amLODipine (NORVASC) 5 MG tablet Take 1 tablet (5 mg total) by mouth daily. 90 tablet 3  . atorvastatin (LIPITOR) 20 MG tablet Take 1 tablet (20 mg total)  by mouth daily. 90 tablet 3  . glipiZIDE (GLUCOTROL) 5 MG tablet Take 2 tablets (10 mg total) by mouth 2 (two) times daily with a meal. 120 tablet 3  . losartan (COZAAR) 100 MG tablet Take 1 tablet (100 mg total) by mouth daily. 90 tablet 3  . metFORMIN (GLUCOPHAGE) 500 MG tablet Take 1 tablet (500 mg total) by mouth 2 (two) times daily with a meal. 60 tablet 0  . amoxicillin-clavulanate (AUGMENTIN) 875-125 MG tablet Take 1 tablet by mouth 2 (two) times daily. (Patient not taking: Reported on 12/30/2019) 20 tablet 0   No current facility-administered medications on file prior to visit.    BP (!) 151/91   Pulse (!) 103   Resp 18   Ht 5\' 6"  (1.676 m)   Wt 222 lb (100.7 kg)   SpO2 100%   BMI 35.83 kg/m       Objective:   Physical Exam  General Mental Status- Alert. General Appearance- Not in acute distress.   .  Neck Carotid Arteries- Normal color. Moisture- Normal Moisture. No carotid bruits. No JVD.  Chest and Lung Exam Auscultation: Breath Sounds:-Normal.  Cardiovascular Auscultation:Rythm- Regular. Murmurs & Other Heart Sounds:Auscultation of the heart reveals- No Murmurs.  Abdomen Inspection:-Inspeection Normal. Palpation/Percussion:Note:No mass. Palpation and Percussion of the abdomen reveal- Non Tender, Non Distended + BS, no rebound or guarding.    Neurologic Cranial Nerve exam:- CN III-XII intact(No nystagmus), symmetric smile. Strength:- 5/5 equal and symmetric strength both upper and lower extremities.      Assessment & Plan:  Your blood pressure is better controlled today compared to last visit.  When I checked BP is is now 140/80.  Continue losartan and chlorthalidone.  You never started amlodipine 5mg  daily.  I think this will get blood pressure to 130/80 range which would be better in light of your cardiovascular risk factors.  For history of hyperlipidemia, please take atorvastatin daily and eat low-cholesterol diet.  For diabetes, please start  Metformin and glipizide.  Eat low sugar diet and attend diabetic education.  Check blood sugars fasting and 1 time postmeal.  Write those numbers down.  Follow-up in 3 weeks to review those numbers and will see if we need to increase Metformin dose.  Noncompliance has been issue and we need to see if you respond to oral medications.  Insulin potential possible treatment option.  Also could refer to our endocrinologist.  For recent tooth infection glad to hear that your pain is resolved but would go ahead and schedule appointment with dentist.  Follow-up in 3 weeks or as needed.

## 2019-12-30 NOTE — Patient Instructions (Signed)
Your blood pressure is better controlled today compared to last visit.  When I checked BP is is now 140/80.  Continue losartan and chlorthalidone.  You never started amlodipine 5mg  daily.  I think this will get blood pressure to 130/80 range which would be better in light of your cardiovascular risk factors.  For history of hyperlipidemia, please take atorvastatin daily and eat low-cholesterol diet.  For diabetes, please start Metformin and glipizide.  Eat low sugar diet and attend diabetic education.  Check blood sugars fasting and 1 time postmeal.  Write those numbers down.  Follow-up in 3 weeks to review those numbers and will see if we need to increase Metformin dose.  Noncompliance has been issue and we need to see if you respond to oral medications.  Insulin potential possible treatment option.  Also could refer to our endocrinologist.  For recent tooth infection glad to hear that your pain is resolved but would go ahead and schedule appointment with dentist.  Follow-up in 3 weeks or as needed.

## 2020-01-21 ENCOUNTER — Other Ambulatory Visit: Payer: Self-pay

## 2020-01-21 ENCOUNTER — Ambulatory Visit: Payer: BC Managed Care – PPO | Admitting: Medical

## 2020-01-21 ENCOUNTER — Encounter: Payer: Self-pay | Admitting: Medical

## 2020-01-21 VITALS — BP 120/85 | HR 114 | Temp 97.9°F | Resp 18 | Ht 66.0 in | Wt 217.8 lb

## 2020-01-21 DIAGNOSIS — E1165 Type 2 diabetes mellitus with hyperglycemia: Secondary | ICD-10-CM | POA: Diagnosis not present

## 2020-01-21 DIAGNOSIS — I10 Essential (primary) hypertension: Secondary | ICD-10-CM | POA: Diagnosis not present

## 2020-01-21 DIAGNOSIS — E1169 Type 2 diabetes mellitus with other specified complication: Secondary | ICD-10-CM

## 2020-01-21 DIAGNOSIS — E785 Hyperlipidemia, unspecified: Secondary | ICD-10-CM

## 2020-01-21 LAB — GLUCOSE, POCT (MANUAL RESULT ENTRY): POC Glucose: 383 mg/dl — AB (ref 70–99)

## 2020-01-21 MED ORDER — METFORMIN HCL 500 MG PO TABS: 500.0000 mg | ORAL_TABLET | Freq: Two times a day (BID) | ORAL | 0 refills | Status: DC

## 2020-01-21 NOTE — Progress Notes (Signed)
Subjective:    Patient ID: Adrienne Thompson, female    DOB: 11-17-1975, 44 y.o.   MRN: [id number]  HPI  Pt in today is 120/85. Pt is on losartan and chlorthalidone. In the past bp not adequately controlled with losartan and chlorthalidone alone. I had asked her to start amlodipine. She states is taking. Recently ran out of chlorthalidone.  Pt has diabetes. She has not been taking glipizide. She has not been checking blood sugars after meals one time a day as I had asked. She is taking metformin. I wrote referral to dietician. Late October. Pt not sure if they tried to contact her. She states has not checked messages. Pt has not eaten since lat night.   Pt has high cholesterol and is on atrovastatin.  Hx of tooth pain. Gave antibiotic and she states fells better. She has not scheduled dentist appointment.   Review of Systems  Constitutional: Negative for chills, fatigue and fever.  Respiratory: Negative for cough, chest tightness, shortness of breath and wheezing.   Cardiovascular: Negative for chest pain and palpitations.  Gastrointestinal: Negative for abdominal pain.  Musculoskeletal: Negative for back pain, joint swelling and neck pain.  Skin: Negative for rash.  Neurological: Negative for dizziness, seizures, numbness and headaches.  Hematological: Negative for adenopathy. Does not bruise/bleed easily.  Psychiatric/Behavioral: Negative for behavioral problems, confusion, sleep disturbance and suicidal ideas. The patient is not nervous/anxious.    Past Medical History:  Diagnosis Date  . Diabetes mellitus without complication (HCC)   . Dyslipidemia   . Hypertension      Social History   Socioeconomic History  . Marital status: Single    Spouse name: Not on file  . Number of children: Not on file  . Years of education: Not on file  . Highest education level: Not on file  Occupational History  . Not on file  Tobacco Use  . Smoking status: Never Smoker  . Smokeless tobacco:  Never Used  Substance and Sexual Activity  . Alcohol use: Never  . Drug use: Never  . Sexual activity: Not on file  Other Topics Concern  . Not on file  Social History Narrative  . Not on file   Social Determinants of Health   Financial Resource Strain:   . Difficulty of Paying Living Expenses: Not on file  Food Insecurity:   . Worried About Programme researcher, broadcasting/film/video in the Last Year: Not on file  . Ran Out of Food in the Last Year: Not on file  Transportation Needs:   . Lack of Transportation (Medical): Not on file  . Lack of Transportation (Non-Medical): Not on file  Physical Activity:   . Days of Exercise per Week: Not on file  . Minutes of Exercise per Session: Not on file  Stress:   . Feeling of Stress : Not on file  Social Connections:   . Frequency of Communication with Friends and Family: Not on file  . Frequency of Social Gatherings with Friends and Family: Not on file  . Attends Religious Services: Not on file  . Active Member of Clubs or Organizations: Not on file  . Attends Banker Meetings: Not on file  . Marital Status: Not on file  Intimate Partner Violence:   . Fear of Current or Ex-Partner: Not on file  . Emotionally Abused: Not on file  . Physically Abused: Not on file  . Sexually Abused: Not on file    No past surgical history on file.  Family History  Problem Relation Age of Onset  . Hypertension Mother   . Diabetes Mother     No Known Allergies  Current Outpatient Medications on File Prior to Visit  Medication Sig Dispense Refill  . amLODipine (NORVASC) 5 MG tablet Take 1 tablet (5 mg total) by mouth daily. 90 tablet 3  . atorvastatin (LIPITOR) 20 MG tablet Take 1 tablet (20 mg total) by mouth daily. 90 tablet 3  . chlorthalidone (HYGROTON) 25 MG tablet Take 1 tablet (25 mg total) by mouth daily. 90 tablet 3  . glipiZIDE (GLUCOTROL) 5 MG tablet Take 2 tablets (10 mg total) by mouth 2 (two) times daily with a meal. 120 tablet 3  .  losartan (COZAAR) 100 MG tablet Take 1 tablet (100 mg total) by mouth daily. 90 tablet 3   No current facility-administered medications on file prior to visit.    BP 120/85 Comment: espac.  Pulse (!) 114   Temp 97.9 F (36.6 C) (Oral)   Resp 18   Ht 5\' 6"  (1.676 m)   Wt 217 lb 12.8 oz (98.8 kg)   SpO2 98%   BMI 35.15 kg/m      Objective:   Physical Exam  General Mental Status- Alert. General Appearance- Not in acute distress.   Skin General: Color- Normal Color. Moisture- Normal Moisture.  Neck Carotid Arteries- Normal color. Moisture- Normal Moisture. No carotid bruits. No JVD.  Chest and Lung Exam Auscultation: Breath Sounds:-Normal.  Cardiovascular Auscultation:Rythm- Regular. Murmurs & Other Heart Sounds:Auscultation of the heart reveals- No Murmurs.  Abdomen Inspection:-Inspeection Normal. Palpation/Percussion:Note:No mass. Palpation and Percussion of the abdomen reveal- Non Tender, Non Distended + BS, no rebound or guarding.    Neurologic Cranial Nerve exam:- CN III-XII intact(No nystagmus), symmetric smile. Strength:- 5/5 equal and symmetric strength both upper and lower extremities.      Assessment & Plan:  You do have a history of recently uncontrolled diabetes.  A1c in the 13 range and today blood sugars in high 300s despite being fasting.  Do recommend that you take Metformin and start glipizide.  Based on sugar level today I do want to go ahead and refer to endocrinologist.  Eat low sugar diet and do start checking your blood sugars postmeal.  If your sugars are not coming down then will probably go ahead and prescribe Lantus insulin pending your appointment with endocrinologist.  Please give me an update by my chart or call on your postmeal blood sugars by Friday afternoon.  If you have sugars exceeding 400 and associated symptoms then go ahead and be evaluated in the emergency department.  Your blood pressure is better today than it has been.  I do  want you to take the losartan, hydrochlorothiazide and amlodipine.  Check your blood pressures daily and give me an update on Friday as well.  For high cholesterol continue atorvastatin.   Follow-up in 10 to 14 days or as needed.

## 2020-01-21 NOTE — Patient Instructions (Signed)
You do have a history of recently uncontrolled diabetes.  A1c in the 13 range and today blood sugars in high 300s despite being fasting.  Do recommend that you take Metformin and start glipizide.  Based on sugar level today I do want to go ahead and refer to endocrinologist.  Eat low sugar diet and do start checking your blood sugars postmeal.  If your sugars are not coming down then will probably go ahead and prescribe Lantus insulin pending your appointment with endocrinologist.  Please give me an update by my chart or call on your postmeal blood sugars by Friday afternoon.  If you have sugars exceeding 400 and associated symptoms then go ahead and be evaluated in the emergency department.  Your blood pressure is better today than it has been.  I do want you to take the losartan, hydrochlorothiazide and amlodipine.  Check your blood pressures daily and give me an update on Friday as well.  For high cholesterol continue atorvastatin.   Follow-up in 10 to 14 days or as needed.

## 2020-02-04 ENCOUNTER — Ambulatory Visit: Payer: BC Managed Care – PPO | Admitting: Medical

## 2020-02-16 ENCOUNTER — Encounter: Payer: Self-pay | Admitting: Internal Medicine

## 2020-08-11 ENCOUNTER — Other Ambulatory Visit: Payer: Self-pay

## 2020-08-11 ENCOUNTER — Ambulatory Visit: Payer: BC Managed Care – PPO | Admitting: Medical

## 2020-08-11 VITALS — BP 150/90 | HR 100 | Temp 98.2°F | Resp 18 | Ht 66.0 in | Wt 200.4 lb

## 2020-08-11 DIAGNOSIS — E1165 Type 2 diabetes mellitus with hyperglycemia: Secondary | ICD-10-CM

## 2020-08-11 DIAGNOSIS — I10 Essential (primary) hypertension: Secondary | ICD-10-CM | POA: Diagnosis not present

## 2020-08-11 DIAGNOSIS — Z9114 Patient's other noncompliance with medication regimen: Secondary | ICD-10-CM

## 2020-08-11 DIAGNOSIS — E785 Hyperlipidemia, unspecified: Secondary | ICD-10-CM | POA: Diagnosis not present

## 2020-08-11 DIAGNOSIS — E1169 Type 2 diabetes mellitus with other specified complication: Secondary | ICD-10-CM | POA: Diagnosis not present

## 2020-08-11 LAB — LIPID PANEL
Cholesterol: 175 mg/dL (ref 0–200)
HDL: 35.2 mg/dL — ABNORMAL LOW (ref >39.00)
LDL Cholesterol: 124 mg/dL — ABNORMAL HIGH (ref 0–99)
NonHDL: 139.42
Total CHOL/HDL Ratio: 5
Triglycerides: 79 mg/dL (ref 0.0–149.0)
VLDL: 15.8 mg/dL (ref 0.0–40.0)

## 2020-08-11 LAB — COMPREHENSIVE METABOLIC PANEL
ALT: 11 U/L (ref 0–35)
AST: 14 U/L (ref 0–37)
Albumin: 4.7 g/dL (ref 3.5–5.2)
Alkaline Phosphatase: 76 U/L (ref 39–117)
BUN: 16 mg/dL (ref 6–23)
CO2: 24 mEq/L (ref 19–32)
Calcium: 10.3 mg/dL (ref 8.4–10.5)
Chloride: 99 mEq/L (ref 96–112)
Creatinine, Ser: 0.81 mg/dL (ref 0.40–1.20)
GFR: 86.69 mL/min (ref >60.00)
Glucose, Bld: 148 mg/dL — ABNORMAL HIGH (ref 70–99)
Potassium: 4.2 mEq/L (ref 3.5–5.1)
Sodium: 136 mEq/L (ref 135–145)
Total Bilirubin: 0.4 mg/dL (ref 0.2–1.2)
Total Protein: 8.3 g/dL (ref 6.0–8.3)

## 2020-08-11 LAB — HEMOGLOBIN A1C: Hgb A1c MFr Bld: 9.8 % — ABNORMAL HIGH (ref 4.6–6.5)

## 2020-08-11 LAB — GLUCOSE, POCT (MANUAL RESULT ENTRY): POC Glucose: 165 mg/dl — AB (ref 70–99)

## 2020-08-11 MED ORDER — AMLODIPINE BESYLATE 5 MG PO TABS: 5.0000 mg | ORAL_TABLET | Freq: Every day | ORAL | 3 refills | Status: DC

## 2020-08-11 MED ORDER — ATORVASTATIN CALCIUM 20 MG PO TABS: 20.0000 mg | ORAL_TABLET | Freq: Every day | ORAL | 3 refills | Status: DC

## 2020-08-11 NOTE — Patient Instructions (Addendum)
Bp high today but non taking amlodipine daily. Please take all 3 bp meds daily as we discussed.  For diabetes will get a1c and cmp. You have been on strict low sugar diet but only taking 1 tab metformin daily due to side effect. 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  For high cholesterol start back on atorvastatin.  10 year cardiovascular risk score reviewed.   Follow up 3 months or as needed

## 2020-08-11 NOTE — Addendum Note (Signed)
Addended by: Gwenevere Abbot on: 08/11/2020 10:18 AM   Modules accepted: Orders

## 2020-08-11 NOTE — Progress Notes (Signed)
Subjective:    Patient ID: Adrienne Thompson, female    DOB: 10/21/1975, 45 y.o.   MRN: [id number]  HPI   Pt bp is mild high today. No cardio or neurologic signs or symptoms. Pt is on chlorthalidone, losartan and norvasc.Pt does not check bp at home. Pt clarifies that she is not taking all of her meds. She does take losartan and chlorthalidone daily. She does not take amlodipine daily.   Pt sugar averages have been high and 6 months ago finger stick was high. Last visit advised to start metformin and glipizide. She is only taking  metformin 1 tab daily. Pt states sometimes feel upset stomach when take metformin. Upset stomach does not happen all the time. I had rx metformin 500 mg 2 tab po bid since her sugars were so high. Pt state has been very strict with low sugar.  For high cholesterol rx'd atorvastatin. She never took.  The 10-year ASCVD risk score Denman George DC Montez Hageman., et al., 2013) is: 22.6%   Values used to calculate the score:     Age: 43 years     Sex: Female     Is Non-Hispanic African American: Yes     Diabetic: Yes     Tobacco smoker: No     Systolic Blood Pressure: 150 mmHg     Is BP treated: Yes     HDL Cholesterol: 37 mg/dL     Total Cholesterol: 193 mg/dL    Pt is school bus driver. Pt worked up until last Thursday. Pt needs to have improved labs by 09-30-2020. Her work mentioned limit on a1c and driving.  Review of Systems  Constitutional:  Negative for chills and fever.  HENT:  Negative for congestion, ear discharge, ear pain, hearing loss and nosebleeds.   Respiratory:  Negative for chest tightness, shortness of breath and wheezing.   Cardiovascular:  Negative for chest pain and palpitations.  Gastrointestinal:  Negative for abdominal pain.  Genitourinary:  Negative for difficulty urinating, dysuria, flank pain and frequency.  Musculoskeletal:  Negative for arthralgias, back pain and joint swelling.  Skin:  Negative for rash.    Past Medical History:  Diagnosis Date    Diabetes mellitus without complication (HCC)    Dyslipidemia    Hypertension      Social History   Socioeconomic History   Marital status: Single    Spouse name: Not on file   Number of children: Not on file   Years of education: Not on file   Highest education level: Not on file  Occupational History   Not on file  Tobacco Use   Smoking status: Never   Smokeless tobacco: Never  Substance and Sexual Activity   Alcohol use: Never   Drug use: Never   Sexual activity: Not on file  Other Topics Concern   Not on file  Social History Narrative   Not on file   Social Determinants of Health   Financial Resource Strain: Not on file  Food Insecurity: Not on file  Transportation Needs: Not on file  Physical Activity: Not on file  Stress: Not on file  Social Connections: Not on file  Intimate Partner Violence: Not on file    No past surgical history on file.  Family History  Problem Relation Age of Onset   Hypertension Mother    Diabetes Mother     No Known Allergies  Current Outpatient Medications on File Prior to Visit  Medication Sig Dispense Refill   amLODipine (NORVASC)  5 MG tablet Take 1 tablet (5 mg total) by mouth daily. 90 tablet 3   atorvastatin (LIPITOR) 20 MG tablet Take 1 tablet (20 mg total) by mouth daily. 90 tablet 3   chlorthalidone (HYGROTON) 25 MG tablet Take 1 tablet (25 mg total) by mouth daily. 90 tablet 3   glipiZIDE (GLUCOTROL) 5 MG tablet Take 2 tablets (10 mg total) by mouth 2 (two) times daily with a meal. 120 tablet 3   losartan (COZAAR) 100 MG tablet Take 1 tablet (100 mg total) by mouth daily. 90 tablet 3   metFORMIN (GLUCOPHAGE) 500 MG tablet Take 1 tablet (500 mg total) by mouth 2 (two) times daily with a meal. 60 tablet 0   No current facility-administered medications on file prior to visit.    BP (!) 150/80   Pulse 100   Temp 98.2 F (36.8 C)   Resp 18   Ht 5\' 6"  (1.676 m)   Wt 200 lb 6.4 oz (90.9 kg)   SpO2 100%   BMI 32.35  kg/m        Objective:   Physical Exam   General Mental Status- Alert. General Appearance- Not in acute distress.   Skin General: Color- Normal Color. Moisture- Normal Moisture.  Neck Carotid Arteries- Normal color. Moisture- Normal Moisture. No carotid bruits. No JVD.  Chest and Lung Exam Auscultation: Breath Sounds:-Normal.  Cardiovascular Auscultation:Rythm- Regular. Murmurs & Other Heart Sounds:Auscultation of the heart reveals- No Murmurs.  Abdomen Inspection:-Inspeection Normal. Palpation/Percussion:Note:No mass. Palpation and Percussion of the abdomen reveal- Non Tender, Non Distended + BS, no rebound or guarding.   Neurologic Cranial Nerve exam:- CN III-XII intact(No nystagmus), symmetric smile. Strength:- 5/5 equal and symmetric strength both upper and lower extremities.      Assessment & Plan:   Bp high today but non taking amlodipine daily. Please take all 3 bp meds daily as we discussed.  For diabetes will get a1c and cmp. You have been on strict low sugar diet but only taking 1 tab metformin daily due to side effect. 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  For high cholesterol start back on atorvastatin.  10 year cardiovascular risk score reviewed.   Follow up 3 months or as needed

## 2020-08-13 ENCOUNTER — Other Ambulatory Visit: Payer: Self-pay

## 2020-08-13 ENCOUNTER — Other Ambulatory Visit: Payer: Self-pay | Admitting: Medical

## 2020-08-13 MED ORDER — METFORMIN HCL 500 MG PO TABS: 500.0000 mg | ORAL_TABLET | Freq: Two times a day (BID) | ORAL | 0 refills | Status: DC

## 2020-08-13 MED ORDER — GLIPIZIDE 5 MG PO TABS: 10.0000 mg | ORAL_TABLET | Freq: Two times a day (BID) | ORAL | 3 refills | Status: DC

## 2020-08-25 ENCOUNTER — Encounter: Payer: Self-pay | Admitting: Medical

## 2020-08-25 ENCOUNTER — Other Ambulatory Visit: Payer: Self-pay

## 2020-08-25 ENCOUNTER — Ambulatory Visit: Payer: BC Managed Care – PPO | Admitting: Medical

## 2020-08-25 VITALS — BP 130/78 | HR 109 | Resp 18 | Ht 66.0 in | Wt 197.6 lb

## 2020-08-25 DIAGNOSIS — E1169 Type 2 diabetes mellitus with other specified complication: Secondary | ICD-10-CM | POA: Diagnosis not present

## 2020-08-25 DIAGNOSIS — I10 Essential (primary) hypertension: Secondary | ICD-10-CM | POA: Diagnosis not present

## 2020-08-25 DIAGNOSIS — E1165 Type 2 diabetes mellitus with hyperglycemia: Secondary | ICD-10-CM

## 2020-08-25 DIAGNOSIS — Z1211 Encounter for screening for malignant neoplasm of colon: Secondary | ICD-10-CM | POA: Diagnosis not present

## 2020-08-25 DIAGNOSIS — E785 Hyperlipidemia, unspecified: Secondary | ICD-10-CM

## 2020-08-25 NOTE — Patient Instructions (Addendum)
History of diabetes with A1c elevated recently.  Your sugar levels recently have improved with addition of higher dose metformin and continue glipizide.  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  I did offer/recommend initially referring you back to endocrinologist but you declined presently.  If sugars do not improve then will refer back to endocrinologist.  Continue to check blood sugars twice daily.  If sugar levels are increasing notify me.  Blood pressure better controlled today.  Continue current BP med regimen.  For hyperlipidemia continue statin use.  Eat low-cholesterol diet.  I did place referral to GI for screening colonoscopy.  Follow-up in 3 months or as needed.

## 2020-08-25 NOTE — Progress Notes (Signed)
Subjective:    Patient ID: Adrienne Thompson, female    DOB: 10/05/1975, 45 y.o.   MRN: [id number]  HPI Pt in for follow up.  Pt has been checking her sugars twice daily. Her a1c was elevated and wanted her to twice daily morning fasting and post meal.    Fasting 174 max to low 137. On average about 150 approx.  Post dinner range 110-99.   Pt did go ahead and increase metformin to twice daily. She is still on glipizide.  Pt has seen Dr. Lonzo Cloud in the past. Looks like lost to follow up/she did not keep appointment.     Review of Systems  Constitutional:  Negative for appetite change, diaphoresis and fever.  Respiratory:  Negative for cough, chest tightness and wheezing.   Cardiovascular:  Negative for chest pain and palpitations.  Gastrointestinal:  Negative for abdominal pain and constipation.  Genitourinary:  Negative for difficulty urinating and dysuria.  Musculoskeletal:  Negative for back pain.  Skin:  Negative for pallor.    Past Medical History:  Diagnosis Date   Diabetes mellitus without complication (HCC)    Dyslipidemia    Hypertension      Social History   Socioeconomic History   Marital status: Single    Spouse name: Not on file   Number of children: Not on file   Years of education: Not on file   Highest education level: Not on file  Occupational History   Not on file  Tobacco Use   Smoking status: Never   Smokeless tobacco: Never  Substance and Sexual Activity   Alcohol use: Never   Drug use: Never   Sexual activity: Not on file  Other Topics Concern   Not on file  Social History Narrative   Not on file   Social Determinants of Health   Financial Resource Strain: Not on file  Food Insecurity: Not on file  Transportation Needs: Not on file  Physical Activity: Not on file  Stress: Not on file  Social Connections: Not on file  Intimate Partner Violence: Not on file    No past surgical history on file.  Family History  Problem  Relation Age of Onset   Hypertension Mother    Diabetes Mother     No Known Allergies  Current Outpatient Medications on File Prior to Visit  Medication Sig Dispense Refill   amLODipine (NORVASC) 5 MG tablet Take 1 tablet (5 mg total) by mouth daily. 90 tablet 3   atorvastatin (LIPITOR) 20 MG tablet Take 1 tablet (20 mg total) by mouth daily. 90 tablet 3   atorvastatin (LIPITOR) 20 MG tablet Take 1 tablet (20 mg total) by mouth daily. 90 tablet 3   chlorthalidone (HYGROTON) 25 MG tablet Take 1 tablet (25 mg total) by mouth daily. 90 tablet 3   glipiZIDE (GLUCOTROL) 5 MG tablet Take 2 tablets (10 mg total) by mouth 2 (two) times daily with a meal. 120 tablet 3   losartan (COZAAR) 100 MG tablet Take 1 tablet (100 mg total) by mouth daily. 90 tablet 3   metFORMIN (GLUCOPHAGE) 500 MG tablet Take 1 tablet (500 mg total) by mouth 2 (two) times daily with a meal. 60 tablet 0   No current facility-administered medications on file prior to visit.    BP (!) 139/98   Pulse (!) 109   Resp 18   Ht 5\' 6"  (1.676 m)   Wt 197 lb 9.6 oz (89.6 kg)   SpO2 100%  BMI 31.89 kg/m       Objective:   Physical Exam  General Mental Status- Alert. General Appearance- Not in acute distress.   Skin General: Color- Normal Color. Moisture- Normal Moisture.  Neck Carotid Arteries- Normal color. Moisture- Normal Moisture. No carotid bruits. No JVD.  Chest and Lung Exam Auscultation: Breath Sounds:-Normal.  Cardiovascular Auscultation:Rythm- Regular. Murmurs & Other Heart Sounds:Auscultation of the heart reveals- No Murmurs.  Abdomen Inspection:-Inspeection Normal. Palpation/Percussion:Note:No mass. Palpation and Percussion of the abdomen reveal- Non Tender, Non Distended + BS, no rebound or guarding.   Neurologic Cranial Nerve exam:- CN III-XII intact(No nystagmus), symmetric smile. Strength:- 5/5 equal and symmetric strength both upper and lower extremities.       Assessment & Plan:    History of diabetes with A1c elevated recently.  Your sugar levels recently have improved with addition of higher dose metformin and continue glipizide.  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  I did offer/recommend initially referring you back to endocrinologist but you declined presently.  If sugars do not improve then will refer back to endocrinologist.  Continue to check blood sugars twice daily.  If sugar levels are increasing notify me.  Blood pressure better controlled today.  Continue current BP med regimen.  For hyperlipidemia continue statin use.  Eat low-cholesterol diet.  I did place referral to GI for screening colonoscopy.  Follow-up in 3 months or as needed.  Esperanza Richters, PA-C

## 2020-09-23 ENCOUNTER — Encounter: Payer: BC Managed Care – PPO | Attending: Medical | Admitting: Skilled Nursing Facility1

## 2020-10-01 ENCOUNTER — Encounter: Payer: BC Managed Care – PPO | Admitting: Family Medicine

## 2020-11-10 ENCOUNTER — Ambulatory Visit: Payer: BC Managed Care – PPO | Admitting: Medical

## 2020-12-01 ENCOUNTER — Other Ambulatory Visit: Payer: Self-pay

## 2020-12-01 ENCOUNTER — Encounter (HOSPITAL_BASED_OUTPATIENT_CLINIC_OR_DEPARTMENT_OTHER): Payer: Self-pay | Admitting: *Deleted

## 2020-12-01 ENCOUNTER — Emergency Department (HOSPITAL_BASED_OUTPATIENT_CLINIC_OR_DEPARTMENT_OTHER)
Admission: EM | Admit: 2020-12-01 | Discharge: 2020-12-01 | Disposition: A | Discharge status: Home / Self Care | Payer: BC Managed Care – PPO | Attending: Emergency Medicine | Admitting: Emergency Medicine

## 2020-12-01 DIAGNOSIS — K0889 Other specified disorders of teeth and supporting structures: Secondary | ICD-10-CM | POA: Diagnosis present

## 2020-12-01 DIAGNOSIS — E1142 Type 2 diabetes mellitus with diabetic polyneuropathy: Secondary | ICD-10-CM | POA: Insufficient documentation

## 2020-12-01 DIAGNOSIS — Z7984 Long term (current) use of oral hypoglycemic drugs: Secondary | ICD-10-CM | POA: Insufficient documentation

## 2020-12-01 DIAGNOSIS — I10 Essential (primary) hypertension: Secondary | ICD-10-CM | POA: Insufficient documentation

## 2020-12-01 DIAGNOSIS — Z79899 Other long term (current) drug therapy: Secondary | ICD-10-CM | POA: Insufficient documentation

## 2020-12-01 DIAGNOSIS — K0381 Cracked tooth: Secondary | ICD-10-CM | POA: Diagnosis not present

## 2020-12-01 MED ORDER — NAPROXEN 375 MG PO TABS: 375.0000 mg | ORAL_TABLET | Freq: Two times a day (BID) | ORAL | 0 refills | Status: DC

## 2020-12-01 MED ORDER — AMOXICILLIN 500 MG PO CAPS: 500.0000 mg | ORAL_CAPSULE | Freq: Three times a day (TID) | ORAL | 0 refills | Status: DC

## 2020-12-01 NOTE — ED Triage Notes (Signed)
Left upper dental pain and subjective fever x 3 days.

## 2020-12-01 NOTE — Discharge Instructions (Signed)
Please refer to the attached instructions. Take medication as directed. It is also important to take your blood pressure medicine everyday.

## 2020-12-01 NOTE — ED Provider Notes (Signed)
[facility] EMERGENCY DEPARTMENT Provider Note   CSN: [id number] Arrival date & time: 12/01/20  1806     History Chief Complaint  Patient presents with   Dental Pain    Adrienne Thompson is a 45 y.o. female.  Patient with left upper dental pain x 3 days. No documented fever (subjective only). Mild left sided facial swelling.  The history is provided by the patient.  Dental Pain Location:  Upper Upper teeth location:  14/LU 1st molar Quality:  Aching, throbbing and pressure-like Severity:  Moderate Onset quality:  Gradual Duration:  3 days Timing:  Constant Progression:  Worsening Context: dental fracture   Ineffective treatments:  None tried Associated symptoms: facial swelling   Associated symptoms: no difficulty swallowing, no oral lesions and no trismus   Risk factors: diabetes       Past Medical History:  Diagnosis Date   Diabetes mellitus without complication (HCC)    Dyslipidemia    Hypertension     Patient Active Problem List   Diagnosis Date Noted   Type 2 diabetes mellitus with diabetic polyneuropathy, without long-term current use of insulin (HCC) 09/13/2018   Type 2 diabetes mellitus with hyperglycemia, without long-term current use of insulin (HCC) 09/13/2018   Essential hypertension 09/13/2018    History reviewed. No pertinent surgical history.   OB History   No obstetric history on file.     Family History  Problem Relation Age of Onset   Hypertension Mother    Diabetes Mother     Social History   Tobacco Use   Smoking status: Never   Smokeless tobacco: Never  Substance Use Topics   Alcohol use: Never   Drug use: Never    Home Medications Prior to Admission medications   Medication Sig Start Date End Date Taking? Authorizing Provider  amLODipine (NORVASC) 5 MG tablet Take 1 tablet (5 mg total) by mouth daily. 08/11/20   Saguier, Ramon Dredge, PA-C  atorvastatin (LIPITOR) 20 MG tablet Take 1 tablet (20 mg total) by mouth daily.  06/17/19   Saguier, Ramon Dredge, PA-C  atorvastatin (LIPITOR) 20 MG tablet Take 1 tablet (20 mg total) by mouth daily. 08/11/20   Saguier, Ramon Dredge, PA-C  chlorthalidone (HYGROTON) 25 MG tablet Take 1 tablet (25 mg total) by mouth daily. 12/30/19   Saguier, Ramon Dredge, PA-C  glipiZIDE (GLUCOTROL) 5 MG tablet Take 2 tablets (10 mg total) by mouth 2 (two) times daily with a meal. 08/13/20   Saguier, Ramon Dredge, PA-C  losartan (COZAAR) 100 MG tablet Take 1 tablet (100 mg total) by mouth daily. 12/18/19   Saguier, Ramon Dredge, PA-C  metFORMIN (GLUCOPHAGE) 500 MG tablet Take 1 tablet (500 mg total) by mouth 2 (two) times daily with a meal. 08/13/20   Saguier, Ramon Dredge, PA-C    Allergies    Patient has no known allergies.  Review of Systems   Review of Systems  HENT:  Positive for dental problem and facial swelling. Negative for mouth sores.   All other systems reviewed and are negative.  Physical Exam Updated Vital Signs BP (!) 190/108 (BP Location: Left Arm)   Pulse (!) 114   Temp 99.5 F (37.5 C) (Oral)   Resp 18   Ht 5\' 6"  (1.676 m)   Wt 96.6 kg   LMP 11/15/2020 (Exact Date)   SpO2 98%   BMI 34.38 kg/m   Physical Exam Constitutional:      Appearance: Normal appearance.  HENT:     Head: Normocephalic.     Nose: Nose  normal.     Mouth/Throat:     Mouth: Mucous membranes are moist.      Comments: Broken tooth Eyes:     Conjunctiva/sclera: Conjunctivae normal.  Cardiovascular:     Rate and Rhythm: Normal rate.  Pulmonary:     Effort: Pulmonary effort is normal.     Breath sounds: Normal breath sounds.  Musculoskeletal:        General: Normal range of motion.     Cervical back: Normal range of motion.  Lymphadenopathy:     Cervical: No cervical adenopathy.  Skin:    General: Skin is warm and dry.  Neurological:     Mental Status: She is alert and oriented to person, place, and time.  Psychiatric:        Mood and Affect: Mood normal.        Behavior: Behavior normal.    ED Results /  Procedures / Treatments   Labs (all labs ordered are listed, but only abnormal results are displayed) Labs Reviewed - No data to display  EKG None  Radiology No results found.  Procedures Procedures   Medications Ordered in ED Medications - No data to display  ED Course  I have reviewed the triage vital signs and the nursing notes.  Pertinent labs & imaging results that were available during my care of the patient were reviewed by me and considered in my medical decision making (see chart for details).    MDM Rules/Calculators/A&P                           Patient with dentalgia.  No abscess requiring immediate incision and drainage.  Exam not concerning for Ludwig's angina or pharyngeal abscess.  Will treat with amoxicillin and naproxen. Pt instructed to follow-up with dentist.  Discussed return precautions. Pt safe for discharge.  Final Clinical Impression(s) / ED Diagnoses Final diagnoses:  Tooth pain    Rx / DC Orders ED Discharge Orders          Ordered    amoxicillin (AMOXIL) 500 MG capsule  3 times daily        12/01/20 1841    naproxen (NAPROSYN) 375 MG tablet  2 times daily        12/01/20 1841             Felicie Morn, NP 12/01/20 1846    Benjiman Core, MD 12/01/20 [phone]

## 2020-12-01 NOTE — ED Notes (Signed)
Noncompliant with BP meds

## 2021-01-12 ENCOUNTER — Emergency Department
Admission: EM | Admit: 2021-01-12 | Discharge: 2021-01-12 | Disposition: A | Discharge status: Home / Self Care | Payer: BC Managed Care – PPO | Source: Home / Self Care

## 2021-01-12 ENCOUNTER — Emergency Department (INDEPENDENT_AMBULATORY_CARE_PROVIDER_SITE_OTHER): Payer: BC Managed Care – PPO

## 2021-01-12 ENCOUNTER — Other Ambulatory Visit: Payer: Self-pay

## 2021-01-12 ENCOUNTER — Encounter: Payer: Self-pay | Admitting: Emergency Medicine

## 2021-01-12 DIAGNOSIS — R1031 Right lower quadrant pain: Secondary | ICD-10-CM | POA: Diagnosis not present

## 2021-01-12 DIAGNOSIS — R310 Gross hematuria: Secondary | ICD-10-CM | POA: Diagnosis not present

## 2021-01-12 LAB — POCT URINALYSIS DIP (MANUAL ENTRY)
Glucose, UA: 100 mg/dL — AB
Leukocytes, UA: NEGATIVE
Nitrite, UA: NEGATIVE
Protein Ur, POC: >=300 mg/dL — AB
Spec Grav, UA: >=1.03 — AB (ref 1.010–1.025)
Urobilinogen, UA: 0.2 E.U./dL
pH, UA: 6 (ref 5.0–8.0)

## 2021-01-12 LAB — I-STAT CREATININE (MANUAL ENTRY): Creatinine, Ser: 0.6 (ref 0.50–1.10)

## 2021-01-12 MED ORDER — IOHEXOL 300 MG/ML  SOLN
100.0000 mL | Freq: Once | INTRAMUSCULAR | Status: AC | PRN
  Administered 2021-01-12: 100 mL via INTRAVENOUS

## 2021-01-12 NOTE — Discharge Instructions (Addendum)
Strongly encouraged, advised patient to follow-up with PCP immediately regarding further evaluation and imaging of large complex mass of the right lower abdomen/pelvis.  Advised patient we will follow-up with urine culture results once received.

## 2021-01-12 NOTE — ED Triage Notes (Signed)
Patient presents to Urgent Care with complaints of stabbing right lower abdominal pain since 5 days ago. Patient reports having menstrual cycle and the pain worsened 8 out of 10 pain scale.Nothing makes the pain better or worst. Movement makes worst. Having bowel changes-more loose. Took Tylenol for pain with little help.

## 2021-01-12 NOTE — ED Provider Notes (Signed)
[facility]rien[facility]    CSN: [id number] Arrival date & time: 01/12/21  [id number]      History   Chief Complaint Chief Complaint  Patient presents with   Abdominal Pain    RLQ    HPI Adrienne Thompson is a 45 y.o. female.   HPI 45 year old female presents with stabbing right lower abdominal pain for 5 days.  Patient has her appendix and reports having menstrual cycle and pain worsening 8 of 10.  Movement makes worse.  Taking Tylenol with little help.  Past Medical History:  Diagnosis Date   Diabetes mellitus without complication ([place])    Dyslipidemia    Hypertension     Patient Active Problem List   Diagnosis Date Noted   Type 2 diabetes mellitus with diabetic polyneuropathy, without long-term current use of insulin ([place]) 09/13/2018   Type 2 diabetes mellitus with hyperglycemia, without long-term current use of insulin ([place]) 09/13/2018   Essential hypertension 09/13/2018    History reviewed. No pertinent surgical history.  OB History   No obstetric history on file.      Home Medications    Prior to Admission medications   Medication Sig Start Date End Date Taking? Authorizing Provider  amLODipine (NORVASC) 5 MG tablet Take 1 tablet (5 mg total) by mouth daily. 08/11/20  Yes Saguier, Percell Miller, PA-C  atorvastatin (LIPITOR) 20 MG tablet Take 1 tablet (20 mg total) by mouth daily. 06/17/19  Yes Saguier, Percell Miller, PA-C  chlorthalidone (HYGROTON) 25 MG tablet Take 1 tablet (25 mg total) by mouth daily. 12/30/19  Yes Saguier, Percell Miller, PA-C  glipiZIDE (GLUCOTROL) 5 MG tablet Take 2 tablets (10 mg total) by mouth 2 (two) times daily with a meal. 08/13/20  Yes Saguier, Percell Miller, PA-C  losartan (COZAAR) 100 MG tablet Take 1 tablet (100 mg total) by mouth daily. 12/18/19  Yes Saguier, Percell Miller, PA-C  metFORMIN (GLUCOPHAGE) 500 MG tablet Take 1 tablet (500 mg total) by mouth 2 (two) times daily with a meal. 08/13/20  Yes Saguier, Percell Miller, PA-C  naproxen (NAPROSYN) 375 MG tablet Take 1 tablet (375  mg total) by mouth 2 (two) times daily. 12/01/20  Yes Etta Quill, NP  amoxicillin (AMOXIL) 500 MG capsule Take 1 capsule (500 mg total) by mouth 3 (three) times daily. 12/01/20   Etta Quill, NP  atorvastatin (LIPITOR) 20 MG tablet Take 1 tablet (20 mg total) by mouth daily. 08/11/20   Saguier, Percell Miller, PA-C    Family History Family History  Problem Relation Age of Onset   Hypertension Mother    Diabetes Mother     Social History Social History   Tobacco Use   Smoking status: Never   Smokeless tobacco: Never  Substance Use Topics   Alcohol use: Never   Drug use: Never     Allergies   Patient has no known allergies.   Review of Systems Review of Systems  Gastrointestinal:  Positive for abdominal pain.  Genitourinary:  Positive for hematuria.  All other systems reviewed and are negative.   Physical Exam Triage Vital Signs ED Triage Vitals  Enc Vitals Group     BP 01/12/21 0946 134/86     Pulse Rate 01/12/21 0946 (!) 125     Resp 01/12/21 0946 18     Temp 01/12/21 0946 99.5 F (37.5 C)     Temp Source 01/12/21 0946 Oral     SpO2 01/12/21 0946 100 %     Weight --      Height --  Head Circumference --      Peak Flow --      Pain Score 01/12/21 0944 8     Pain Loc --      Pain Edu? --      Excl. in GC? --    No data found.  Updated Vital Signs BP 134/86 (BP Location: Left Arm)   Pulse (!) 125   Temp 99.5 F (37.5 C) (Oral)   Resp 18   SpO2 100%      Physical Exam Vitals and nursing note reviewed.  Constitutional:      General: She is not in acute distress.    Appearance: She is well-developed and normal weight. She is not ill-appearing.  HENT:     Head: Normocephalic and atraumatic.     Mouth/Throat:     Mouth: Mucous membranes are moist.     Pharynx: Oropharynx is clear.  Eyes:     Extraocular Movements: Extraocular movements intact.     Pupils: Pupils are equal, round, and reactive to light.  Cardiovascular:     Rate and Rhythm: Normal  rate and regular rhythm.     Heart sounds: Normal heart sounds. No murmur heard. Pulmonary:     Effort: Pulmonary effort is normal.     Breath sounds: Normal breath sounds.  Abdominal:     General: Abdomen is flat. Bowel sounds are absent. There is distension. There is no abdominal bruit.     Palpations: There is mass. There is no fluid wave, hepatomegaly, splenomegaly or pulsatile mass.     Tenderness: There is abdominal tenderness in the right lower quadrant. There is guarding and rebound. There is no right CVA tenderness or left CVA tenderness. Negative signs include Murphy's sign, Rovsing's sign and McBurney's sign.  Skin:    General: Skin is warm and dry.  Neurological:     General: No focal deficit present.     Mental Status: She is alert and oriented to person, place, and time.     UC Treatments / Results  Labs (all labs ordered are listed, but only abnormal results are displayed) Labs Reviewed  POCT URINALYSIS DIP (MANUAL ENTRY) - Abnormal; Notable for the following components:      Result Value   Color, UA red (*)    Clarity, UA turbid (*)    Glucose, UA =100 (*)    Bilirubin, UA moderate (*)    Ketones, POC UA moderate (40) (*)    Spec Grav, UA >=1.030 (*)    Blood, UA large (*)    Protein Ur, POC >=300 (*)    All other components within normal limits  URINE CULTURE    EKG   Radiology CT ABDOMEN PELVIS W CONTRAST  Result Date: 01/12/2021 CLINICAL DATA:  Right lower quadrant pain EXAM: CT ABDOMEN AND PELVIS WITH CONTRAST TECHNIQUE: Multidetector CT imaging of the abdomen and pelvis was performed using the standard protocol following bolus administration of intravenous contrast. CONTRAST:  OMNIPAQUE IOHEXOL 300 MG/ML  SOLN COMPARISON:  None. FINDINGS: Lower chest: Mild subsegmental atelectatic changes. Hepatobiliary: No focal liver abnormality is seen. No gallstones, gallbladder wall thickening, or biliary dilatation. Pancreas: Unremarkable. No pancreatic  ductal dilatation or surrounding inflammatory changes. Spleen: Normal in size without focal abnormality. Adrenals/Urinary Tract: Adrenal glands are unremarkable. Kidneys are normal, without renal calculi, focal lesion, or hydronephrosis. Bladder is unremarkable. Stomach/Bowel: Stomach appears unremarkable. No bowel obstruction, free air or pneumatosis identified. Vascular/Lymphatic: No significant vascular findings are present. No enlarged abdominal or  pelvic lymph nodes. Reproductive: Uterus is enlarged, lobulated and heterogeneous with rounded masses measuring up to 8.2 cm, likely representing fibroids. Other: There is a somewhat lobulated and bilobed appearing mass in the right lower quadrant of the abdomen/pelvis which measures up to 7.2 x 10.1 x 12.8 cm in AP, transverse and craniocaudal dimensions. The mass is fairly homogeneous overall measuring approximally 45 Hounsfield unit density throughout with a few scattered coarse irregular calcifications centrally and mild-to-moderate surrounding fat stranding. The mass abuts and could potentially arise from the right ovary, cecum, appendix. No frank ascites visualized. Musculoskeletal: No acute or significant osseous findings. IMPRESSION: 1. Large complex mass in the right lower abdomen/pelvis as described. Indeterminate origin of the mass which abuts and could potentially arise from the right ovary, cecum, appendix. Further evaluation with MRI may be helpful. Correlate clinically and surgical consultation recommended. 2. Enlarged fibroid uterus. Electronically Signed   By: Ofilia Neas M.D.   On: 01/12/2021 11:30    Procedures Procedures (including critical care time)  Medications Ordered in UC Medications - No data to display  Initial Impression / Assessment and Plan / UC Course  I have reviewed the triage vital signs and the nursing notes.  Pertinent labs & imaging results that were available during my care of the patient were reviewed by me and  considered in my medical decision making (see chart for details).     MDM: 1.  Right lower quadrant abdominal pain-CT of abdomen pelvis with contrast reveals above going from impression above: Large complex mass in right lower quadrant abdomen/pelvis as described indeterminate origin of the mass which abuts and could potentially arise from the right ovary, cecum, or appendix.  Further evaluation with MRI advised surgical consult recommended. Enlarged fibroid uterus also noted.  Patient provided study on disc prior to discharge and strongly advised to follow-up with PCP for further evaluation is soon as possible.  2.  Gross hematuria-UA revealed above, urine culture ordered. Strongly encouraged, advised patient to follow-up with PCP immediately regarding further evaluation and imaging of large complex mass of the right lower abdomen/pelvis.  Patient agreed and verbalized understanding of these instructions and this plan of care today.  Advised patient we will follow-up with urine culture results once received.  Discharged home, hemodynamically stable. Final Clinical Impressions(s) / UC Diagnoses   Final diagnoses:  Right lower quadrant abdominal pain  Gross hematuria     Discharge Instructions      Strongly encouraged, advised patient to follow-up with PCP immediately regarding further evaluation and imaging of large complex mass of the right lower abdomen/pelvis.  Advised patient we will follow-up with urine culture results once received.     ED Prescriptions   None    PDMP not reviewed this encounter.   Eliezer Lofts, Pill[place] 01/12/21 1203

## 2021-01-13 ENCOUNTER — Encounter (HOSPITAL_BASED_OUTPATIENT_CLINIC_OR_DEPARTMENT_OTHER): Payer: Self-pay | Admitting: Emergency Medicine

## 2021-01-13 ENCOUNTER — Telehealth: Payer: Self-pay | Admitting: Medical

## 2021-01-13 ENCOUNTER — Emergency Department (HOSPITAL_BASED_OUTPATIENT_CLINIC_OR_DEPARTMENT_OTHER)
Admission: EM | Admit: 2021-01-13 | Discharge: 2021-01-13 | Disposition: A | Discharge status: Home / Self Care | Payer: BC Managed Care – PPO | Attending: Emergency Medicine | Admitting: Emergency Medicine

## 2021-01-13 DIAGNOSIS — Z7984 Long term (current) use of oral hypoglycemic drugs: Secondary | ICD-10-CM | POA: Diagnosis not present

## 2021-01-13 DIAGNOSIS — E876 Hypokalemia: Secondary | ICD-10-CM | POA: Diagnosis not present

## 2021-01-13 DIAGNOSIS — R1903 Right lower quadrant abdominal swelling, mass and lump: Secondary | ICD-10-CM | POA: Insufficient documentation

## 2021-01-13 DIAGNOSIS — D509 Iron deficiency anemia, unspecified: Secondary | ICD-10-CM | POA: Insufficient documentation

## 2021-01-13 DIAGNOSIS — R5383 Other fatigue: Secondary | ICD-10-CM | POA: Insufficient documentation

## 2021-01-13 DIAGNOSIS — E119 Type 2 diabetes mellitus without complications: Secondary | ICD-10-CM | POA: Insufficient documentation

## 2021-01-13 DIAGNOSIS — Z20822 Contact with and (suspected) exposure to covid-19: Secondary | ICD-10-CM | POA: Insufficient documentation

## 2021-01-13 DIAGNOSIS — R1031 Right lower quadrant pain: Secondary | ICD-10-CM | POA: Diagnosis present

## 2021-01-13 DIAGNOSIS — Z79899 Other long term (current) drug therapy: Secondary | ICD-10-CM | POA: Diagnosis not present

## 2021-01-13 DIAGNOSIS — R197 Diarrhea, unspecified: Secondary | ICD-10-CM | POA: Insufficient documentation

## 2021-01-13 DIAGNOSIS — I10 Essential (primary) hypertension: Secondary | ICD-10-CM | POA: Diagnosis not present

## 2021-01-13 LAB — CBC WITH DIFFERENTIAL/PLATELET
Abs Immature Granulocytes: 0.05 10*3/uL (ref 0.00–0.07)
Basophils Absolute: 0.1 10*3/uL (ref 0.0–0.1)
Basophils Relative: 0 %
Eosinophils Absolute: 0.1 10*3/uL (ref 0.0–0.5)
Eosinophils Relative: 1 %
HCT: 28.6 % — ABNORMAL LOW (ref 36.0–46.0)
Hemoglobin: 8.6 g/dL — ABNORMAL LOW (ref 12.0–15.0)
Immature Granulocytes: 0 %
Lymphocytes Relative: 13 %
Lymphs Abs: 1.8 10*3/uL (ref 0.7–4.0)
MCH: 21.4 pg — ABNORMAL LOW (ref 26.0–34.0)
MCHC: 30.1 g/dL (ref 30.0–36.0)
MCV: 71.3 fL — ABNORMAL LOW (ref 80.0–100.0)
Monocytes Absolute: 0.6 10*3/uL (ref 0.1–1.0)
Monocytes Relative: 5 %
Neutro Abs: 10.8 10*3/uL — ABNORMAL HIGH (ref 1.7–7.7)
Neutrophils Relative %: 81 %
Platelets: 634 10*3/uL — ABNORMAL HIGH (ref 150–400)
RBC: 4.01 MIL/uL (ref 3.87–5.11)
RDW: 15.4 % (ref 11.5–15.5)
WBC: 13.4 10*3/uL — ABNORMAL HIGH (ref 4.0–10.5)
nRBC: 0 % (ref 0.0–0.2)

## 2021-01-13 LAB — COMPREHENSIVE METABOLIC PANEL
ALT: 9 U/L (ref 0–44)
AST: 12 U/L — ABNORMAL LOW (ref 15–41)
Albumin: 3.4 g/dL — ABNORMAL LOW (ref 3.5–5.0)
Alkaline Phosphatase: 99 U/L (ref 38–126)
Anion gap: 11 (ref 5–15)
BUN: 17 mg/dL (ref 6–20)
CO2: 23 mmol/L (ref 22–32)
Calcium: 8.7 mg/dL — ABNORMAL LOW (ref 8.9–10.3)
Chloride: 99 mmol/L (ref 98–111)
Creatinine, Ser: 0.79 mg/dL (ref 0.44–1.00)
GFR, Estimated: >60 mL/min (ref >60)
Glucose, Bld: 271 mg/dL — ABNORMAL HIGH (ref 70–99)
Potassium: 2.6 mmol/L — CL (ref 3.5–5.1)
Sodium: 133 mmol/L — ABNORMAL LOW (ref 135–145)
Total Bilirubin: 0.4 mg/dL (ref 0.3–1.2)
Total Protein: 8.6 g/dL — ABNORMAL HIGH (ref 6.5–8.1)

## 2021-01-13 LAB — RESP PANEL BY RT-PCR (FLU A&B, COVID) ARPGX2
Influenza A by PCR: NEGATIVE
Influenza B by PCR: NEGATIVE
SARS Coronavirus 2 by RT PCR: NEGATIVE

## 2021-01-13 LAB — POTASSIUM: Potassium: 3 mmol/L — ABNORMAL LOW (ref 3.5–5.1)

## 2021-01-13 LAB — LIPASE, BLOOD: Lipase: 27 U/L (ref 11–51)

## 2021-01-13 LAB — MAGNESIUM: Magnesium: 1.8 mg/dL (ref 1.7–2.4)

## 2021-01-13 MED ORDER — SODIUM CHLORIDE 0.9 % IV SOLN: INTRAVENOUS | Status: AC

## 2021-01-13 MED ORDER — ONDANSETRON HCL 4 MG/2ML IJ SOLN: 4.0000 mg | Freq: Once | INTRAMUSCULAR | Status: DC

## 2021-01-13 MED ORDER — POTASSIUM CHLORIDE CRYS ER 20 MEQ PO TBCR: 20.0000 meq | EXTENDED_RELEASE_TABLET | Freq: Two times a day (BID) | ORAL | 0 refills | Status: DC

## 2021-01-13 MED ORDER — SODIUM CHLORIDE 0.9 % IV BOLUS
1000.0000 mL | Freq: Once | INTRAVENOUS | Status: AC
  Administered 2021-01-13: 1000 mL via INTRAVENOUS

## 2021-01-13 MED ORDER — POTASSIUM CHLORIDE 10 MEQ/100ML IV SOLN
10.0000 meq | INTRAVENOUS | Status: AC
Start: 2021-01-13 — End: 2021-01-13
  Administered 2021-01-13 (×2): 10 meq via INTRAVENOUS
  Filled 2021-01-13 (×2): qty 100

## 2021-01-13 MED ORDER — MAGNESIUM OXIDE -MG SUPPLEMENT 400 (240 MG) MG PO TABS
400.0000 mg | ORAL_TABLET | Freq: Once | ORAL | Status: AC
  Administered 2021-01-13: 400 mg via ORAL
  Filled 2021-01-13: qty 1

## 2021-01-13 MED ORDER — ONDANSETRON HCL 4 MG/2ML IJ SOLN
4.0000 mg | Freq: Once | INTRAMUSCULAR | Status: AC
  Administered 2021-01-13: 4 mg via INTRAVENOUS
  Filled 2021-01-13: qty 2

## 2021-01-13 MED ORDER — POTASSIUM CHLORIDE CRYS ER 20 MEQ PO TBCR
40.0000 meq | EXTENDED_RELEASE_TABLET | Freq: Once | ORAL | Status: AC
  Administered 2021-01-13: 40 meq via ORAL
  Filled 2021-01-13: qty 2

## 2021-01-13 MED ORDER — MORPHINE SULFATE (PF) 4 MG/ML IV SOLN
4.0000 mg | Freq: Once | INTRAVENOUS | Status: AC
  Administered 2021-01-13: 4 mg via INTRAVENOUS
  Filled 2021-01-13: qty 1

## 2021-01-13 MED ORDER — HYDROCODONE-ACETAMINOPHEN 5-325 MG PO TABS: 2.0000 | ORAL_TABLET | ORAL | 0 refills | Status: DC | PRN

## 2021-01-13 NOTE — ED Triage Notes (Signed)
Pt reports RLQ abd pain for the past 2 days accompanied by loose stools. No n/v/d.

## 2021-01-13 NOTE — ED Provider Notes (Signed)
[facility] EMERGENCY DEPARTMENT Provider Note   CSN: [id number] Arrival date & time: 01/13/21  [id number]     History Chief Complaint  Patient presents with   Abdominal Pain    Adrienne Thompson is a 45 y.o. female.  This is a 45 y.o. female with significant medical history as below, including DM, HLD, HTN who presents to the ED with complaint of abdominal pain to RLQ.  O  Location: Right lower quadrant Duration: 4 to 5 days Onset: Gradual Timing: Constant Description: Aching, pressure, mild tenderness Severity: Mild Exacerbating/Alleviating Factors: Worse with torso twisting, palpation, flexion of her torso Associated Symptoms: Poor appetite, generalized malaise and fatigue, loose stool x1 day Pertinent Negatives: No fevers, chills, nausea, vomiting, no melena or bright red blood per rectum  Context: Patient with 5 days of abdominal pain.  She is evaluated urgent care yesterday, received CT abdomen pelvis IV contrast with did show complex mass of right lower quadrant of unclear origin.  Also found to have hematuria on UA, pt thinks she may be starting menstrual cycle. She was recommended to follow-up with general surgery or the PCP regarding abnormal imaging findings which she was unable to do so   The history is provided by the patient. No language interpreter was used.  Abdominal Pain Associated symptoms: diarrhea and fatigue   Associated symptoms: no chest pain, no cough, no dysuria, no nausea and no shortness of breath       Past Medical History:  Diagnosis Date   Diabetes mellitus without complication ([facility])    Dyslipidemia    Hypertension     Patient Active Problem List   Diagnosis Date Noted   Type 2 diabetes mellitus with diabetic polyneuropathy, without long-term current use of insulin ([facility]) 09/13/2018   Type 2 diabetes mellitus with hyperglycemia, without long-term current use of insulin ([facility]) 09/13/2018   Essential hypertension 09/13/2018    History  reviewed. No pertinent surgical history.   OB History   No obstetric history on file.     Family History  Problem Relation Age of Onset   Hypertension Mother    Diabetes Mother     Social History   Tobacco Use   Smoking status: Never   Smokeless tobacco: Never  Substance Use Topics   Alcohol use: Never   Drug use: Never    Home Medications Prior to Admission medications   Medication Sig Start Date End Date Taking? Authorizing Provider  HYDROcodone-acetaminophen (NORCO/VICODIN) 5-325 MG tablet Take 2 tablets by mouth every 4 (four) hours as needed. 01/13/21  Yes Tanda RockersGray, Raunak Antuna A, DO  potassium chloride SA (KLOR-CON) 20 MEQ tablet Take 1 tablet (20 mEq total) by mouth 2 (two) times daily for 3 days. 01/13/21 01/16/21 Yes Tanda RockersGray, Aubree Doody A, DO  amLODipine (NORVASC) 5 MG tablet Take 1 tablet (5 mg total) by mouth daily. 08/11/20   Saguier, Ramon DredgeEdward, PA-C  amoxicillin (AMOXIL) 500 MG capsule Take 1 capsule (500 mg total) by mouth 3 (three) times daily. 12/01/20   Felicie MornSmith, David, NP  atorvastatin (LIPITOR) 20 MG tablet Take 1 tablet (20 mg total) by mouth daily. 06/17/19   Saguier, Ramon DredgeEdward, PA-C  atorvastatin (LIPITOR) 20 MG tablet Take 1 tablet (20 mg total) by mouth daily. 08/11/20   Saguier, Ramon DredgeEdward, PA-C  chlorthalidone (HYGROTON) 25 MG tablet Take 1 tablet (25 mg total) by mouth daily. 12/30/19   Saguier, Ramon DredgeEdward, PA-C  glipiZIDE (GLUCOTROL) 5 MG tablet Take 2 tablets (10 mg total) by mouth 2 (two) times daily with  a meal. 08/13/20   Saguier, Percell Miller, PA-C  losartan (COZAAR) 100 MG tablet Take 1 tablet (100 mg total) by mouth daily. 12/18/19   Saguier, Percell Miller, PA-C  metFORMIN (GLUCOPHAGE) 500 MG tablet Take 1 tablet (500 mg total) by mouth 2 (two) times daily with a meal. 08/13/20   Saguier, Percell Miller, PA-C  naproxen (NAPROSYN) 375 MG tablet Take 1 tablet (375 mg total) by mouth 2 (two) times daily. 12/01/20   Etta Quill, NP    Allergies    Patient has no known allergies.  Review of Systems    Review of Systems  Constitutional:  Positive for appetite change and fatigue. Negative for activity change and unexpected weight change.  HENT:  Negative for facial swelling and trouble swallowing.   Eyes:  Negative for discharge and redness.  Respiratory:  Negative for cough and shortness of breath.   Cardiovascular:  Negative for chest pain and palpitations.  Gastrointestinal:  Positive for abdominal pain and diarrhea. Negative for blood in stool and nausea.  Genitourinary:  Negative for dysuria and flank pain.  Musculoskeletal:  Negative for back pain and gait problem.  Skin:  Negative for pallor and rash.  Neurological:  Negative for syncope and headaches.   Physical Exam Updated Vital Signs BP (!) 148/91   Pulse 91   Temp 97.7 F (36.5 C) (Oral)   Resp 16   SpO2 99%   Physical Exam Vitals and nursing note reviewed.  Constitutional:      General: She is not in acute distress.    Appearance: Normal appearance.  HENT:     Head: Normocephalic and atraumatic.     Right Ear: External ear normal.     Left Ear: External ear normal.     Nose: Nose normal.     Mouth/Throat:     Mouth: Mucous membranes are moist.  Eyes:     General: No scleral icterus.       Right eye: No discharge.        Left eye: No discharge.  Cardiovascular:     Rate and Rhythm: Normal rate and regular rhythm.     Pulses: Normal pulses.     Heart sounds: Normal heart sounds.  Pulmonary:     Effort: Pulmonary effort is normal. No respiratory distress.     Breath sounds: Normal breath sounds.  Abdominal:     General: Abdomen is flat.     Tenderness: There is abdominal tenderness in the right lower quadrant. There is no guarding or rebound.  Musculoskeletal:        General: Normal range of motion.     Cervical back: Normal range of motion.     Right lower leg: No edema.     Left lower leg: No edema.  Skin:    General: Skin is warm and dry.     Capillary Refill: Capillary refill takes less than 2  seconds.  Neurological:     Mental Status: She is alert.  Psychiatric:        Mood and Affect: Mood normal.        Behavior: Behavior normal.    ED Results / Procedures / Treatments   Labs (all labs ordered are listed, but only abnormal results are displayed) Labs Reviewed  CBC WITH DIFFERENTIAL/PLATELET - Abnormal; Notable for the following components:      Result Value   WBC 13.4 (*)    Hemoglobin 8.6 (*)    HCT 28.6 (*)    MCV 71.3 (*)  MCH 21.4 (*)    Platelets 634 (*)    Neutro Abs 10.8 (*)    All other components within normal limits  COMPREHENSIVE METABOLIC PANEL - Abnormal; Notable for the following components:   Sodium 133 (*)    Potassium 2.6 (*)    Glucose, Bld 271 (*)    Calcium 8.7 (*)    Total Protein 8.6 (*)    Albumin 3.4 (*)    AST 12 (*)    All other components within normal limits  POTASSIUM - Abnormal; Notable for the following components:   Potassium 3.0 (*)    All other components within normal limits  RESP PANEL BY RT-PCR (FLU A&B, COVID) ARPGX2  LIPASE, BLOOD  MAGNESIUM  HCG, SERUM, QUALITATIVE    EKG None  Radiology CT ABDOMEN PELVIS W CONTRAST  Result Date: 01/12/2021 CLINICAL DATA:  Right lower quadrant pain EXAM: CT ABDOMEN AND PELVIS WITH CONTRAST TECHNIQUE: Multidetector CT imaging of the abdomen and pelvis was performed using the standard protocol following bolus administration of intravenous contrast. CONTRAST:  OMNIPAQUE IOHEXOL 300 MG/ML  SOLN COMPARISON:  None. FINDINGS: Lower chest: Mild subsegmental atelectatic changes. Hepatobiliary: No focal liver abnormality is seen. No gallstones, gallbladder wall thickening, or biliary dilatation. Pancreas: Unremarkable. No pancreatic ductal dilatation or surrounding inflammatory changes. Spleen: Normal in size without focal abnormality. Adrenals/Urinary Tract: Adrenal glands are unremarkable. Kidneys are normal, without renal calculi, focal lesion, or hydronephrosis. Bladder is  unremarkable. Stomach/Bowel: Stomach appears unremarkable. No bowel obstruction, free air or pneumatosis identified. Vascular/Lymphatic: No significant vascular findings are present. No enlarged abdominal or pelvic lymph nodes. Reproductive: Uterus is enlarged, lobulated and heterogeneous with rounded masses measuring up to 8.2 cm, likely representing fibroids. Other: There is a somewhat lobulated and bilobed appearing mass in the right lower quadrant of the abdomen/pelvis which measures up to 7.2 x 10.1 x 12.8 cm in AP, transverse and craniocaudal dimensions. The mass is fairly homogeneous overall measuring approximally 45 Hounsfield unit density throughout with a few scattered coarse irregular calcifications centrally and mild-to-moderate surrounding fat stranding. The mass abuts and could potentially arise from the right ovary, cecum, appendix. No frank ascites visualized. Musculoskeletal: No acute or significant osseous findings. IMPRESSION: 1. Large complex mass in the right lower abdomen/pelvis as described. Indeterminate origin of the mass which abuts and could potentially arise from the right ovary, cecum, appendix. Further evaluation with MRI may be helpful. Correlate clinically and surgical consultation recommended. 2. Enlarged fibroid uterus. Electronically Signed   By: Jannifer Hick M.D.   On: 01/12/2021 11:30    Procedures .Critical Care Performed by: Sloan Leiter, DO Authorized by: Sloan Leiter, DO   Critical care provider statement:    Critical care time (minutes):  30   Critical care time was exclusive of:  Separately billable procedures and treating other patients   Critical care was necessary to treat or prevent imminent or life-threatening deterioration of the following conditions: hypok.   Critical care was time spent personally by me on the following activities:  Development of treatment plan with patient or surrogate, discussions with consultants, evaluation of patient's  response to treatment, examination of patient, ordering and review of laboratory studies, ordering and review of radiographic studies, ordering and performing treatments and interventions, pulse oximetry, re-evaluation of patient's condition and review of old charts Comments:     Hypokalemia requiring IV replacement and oral replacement, cardiac monitoring     Medications Ordered in ED Medications  0.9 %  sodium  chloride infusion (0 mLs Intravenous Stopped 01/13/21 1415)  sodium chloride 0.9 % bolus 1,000 mL (0 mLs Intravenous Stopped 01/13/21 1046)  morphine 4 MG/ML injection 4 mg (4 mg Intravenous Given 01/13/21 0944)  ondansetron (ZOFRAN) injection 4 mg (4 mg Intravenous Given 01/13/21 0943)  potassium chloride 10 mEq in 100 mL IVPB (0 mEq Intravenous Stopped 01/13/21 1319)  potassium chloride SA (KLOR-CON) CR tablet 40 mEq (40 mEq Oral Given 01/13/21 1059)  magnesium oxide (MAG-OX) tablet 400 mg (400 mg Oral Given 01/13/21 1059)    ED Course  I have reviewed the triage vital signs and the nursing notes.  Pertinent labs & imaging results that were available during my care of the patient were reviewed by me and considered in my medical decision making (see chart for details).    MDM Rules/Calculators/A&P                           CC: Right lower quadrant abdominal pain, abnormal CT  This patient complains of above; this involves an extensive number of treatment options and is a complaint that carries with it a high risk of complications and morbidity. Vital signs were reviewed. Serious etiologies considered.  Record review:  Previous records obtained and reviewed   Work up as above, notable for:  Labs & imaging results that were available during my care of the patient were reviewed by me and considered in my medical decision making.   CT imaging from yesterday was reviewed.  Patient with no significant change or symptoms in the past 24 hours, will not repeat CT imaging at this  time. Abdomen is soft, not peritoneal.   Management: Patient given IV fluids, analgesics.   Her potassium was found to be depleted, normal magnesium, given IV and oral potassium replacement, oral magnesium replacement.  Potassium has improved.  She is able to tolerate oral intake.  She has been having some intermittent diarrhea over the past 24 hours which has been improving.  Recommend discharge patient on oral potassium replacement and have her follow-up with her PCP in 3 days to repeat potassium level.  Patient also given analgesics for pain control of her right lower quadrant discomfort.  Patient hemoglobin mildly reduced at baseline, she is currently menstruating.  Black stool or blood per rectum, no increased bruising.  Advised her to follow-up with PCP regarding this, she may require addition of iron supplements in the future for her microcytic anemia  Given complex mass of right lower quadrant will discuss with general surgery regarding management  Discussed with general surgery on-call Dr. Georgette Dover, he reviewed CT imaging findings from yesterday.  Recommends follow-up with Gyn-Onc and MRI to further differentiate.  Suspicion for appendicitis is low  MRI scheduled [facility] at [facility], patient also given follow-up instruction with gynecology oncology.   The patient improved significantly and was discharged in stable condition. Detailed discussions were had with the patient regarding current findings, and need for close f/u with PCP or on call doctor. The patient has been instructed to return immediately if the symptoms worsen in any way for re-evaluation. Patient verbalized understanding and is in agreement with current care plan. All questions answered prior to discharge.       This chart was dictated using voice recognition software.  Despite best efforts to proofread,  errors can occur which can change the documentation meaning.  Final Clinical Impression(s) / ED  Diagnoses Final diagnoses:  Abdominal mass, right lower  quadrant  Hypokalemia  Microcytic anemia    Rx / DC Orders ED Discharge Orders          Ordered    MR ABDOMEN [facility] CONTRAST        01/13/21 0949    HYDROcodone-acetaminophen (NORCO/VICODIN) 5-325 MG tablet  Every 4 hours PRN        01/13/21 1356    potassium chloride SA (KLOR-CON) 20 MEQ tablet  2 times daily        01/13/21 1356             Sloan Leiter, DO 01/13/21 1432

## 2021-01-13 NOTE — Discharge Instructions (Addendum)
There was a large complex mass noted on your CT from urgent care yesterday. We have ordered an MRI of your abdomen and pelvis for tomorrow at [facility] in [place] to better evaluate this abnormality. Report for MRI tomorrow morning at [facility].   Recommend you follow up with Dr Nelly Rout (gynecology oncology) after your MRI

## 2021-01-13 NOTE — Telephone Encounter (Signed)
Recent ED visit. Please call pt and have her schedule follow up.

## 2021-01-14 LAB — URINE CULTURE
MICRO NUMBER:: 12675508
SPECIMEN QUALITY:: ADEQUATE

## 2021-09-23 ENCOUNTER — Other Ambulatory Visit: Payer: Self-pay | Admitting: Medical

## 2021-09-23 ENCOUNTER — Telehealth: Payer: Self-pay | Admitting: Medical

## 2021-09-23 MED ORDER — GLIPIZIDE 5 MG PO TABS: 10.0000 mg | ORAL_TABLET | Freq: Two times a day (BID) | ORAL | 3 refills | Status: DC

## 2021-09-23 MED ORDER — METFORMIN HCL 500 MG PO TABS: 500.0000 mg | ORAL_TABLET | Freq: Two times a day (BID) | ORAL | 0 refills | Status: DC

## 2021-09-23 NOTE — Telephone Encounter (Signed)
Rx sent 

## 2021-09-23 NOTE — Telephone Encounter (Signed)
Medication: metFORMIN (GLUCOPHAGE) 500 MG tablet  glipiZIDE (GLUCOTROL) 5 MG tablet  Has the patient contacted their pharmacy? Yes.     Preferred Pharmacy:   [id number] [place] - [address] AT [facility] [address]   [address], [place] [address]  Phone:  [phone]  Fax:  [phone]

## 2021-09-28 ENCOUNTER — Encounter: Payer: Self-pay | Admitting: Medical

## 2021-09-28 ENCOUNTER — Ambulatory Visit: Payer: BC Managed Care – PPO | Admitting: Medical

## 2021-09-28 VITALS — BP 125/70 | HR 96 | Temp 98.3°F | Ht 66.0 in | Wt 206.4 lb

## 2021-09-28 DIAGNOSIS — E1165 Type 2 diabetes mellitus with hyperglycemia: Secondary | ICD-10-CM

## 2021-09-28 DIAGNOSIS — E1169 Type 2 diabetes mellitus with other specified complication: Secondary | ICD-10-CM | POA: Diagnosis not present

## 2021-09-28 DIAGNOSIS — I10 Essential (primary) hypertension: Secondary | ICD-10-CM | POA: Diagnosis not present

## 2021-09-28 DIAGNOSIS — E785 Hyperlipidemia, unspecified: Secondary | ICD-10-CM

## 2021-09-28 LAB — COMPREHENSIVE METABOLIC PANEL
ALT: 9 U/L (ref 0–35)
AST: 14 U/L (ref 0–37)
Albumin: 4.3 g/dL (ref 3.5–5.2)
Alkaline Phosphatase: 103 U/L (ref 39–117)
BUN: 29 mg/dL — ABNORMAL HIGH (ref 6–23)
CO2: 24 mEq/L (ref 19–32)
Calcium: 9.6 mg/dL (ref 8.4–10.5)
Chloride: 85 mEq/L — ABNORMAL LOW (ref 96–112)
Creatinine, Ser: 3.26 mg/dL — ABNORMAL HIGH (ref 0.40–1.20)
GFR: 16.17 mL/min — ABNORMAL LOW (ref >60.00)
Glucose, Bld: 119 mg/dL — ABNORMAL HIGH (ref 70–99)
Potassium: 3.8 mEq/L (ref 3.5–5.1)
Sodium: 123 mEq/L — ABNORMAL LOW (ref 135–145)
Total Bilirubin: 0.5 mg/dL (ref 0.2–1.2)
Total Protein: 8.3 g/dL (ref 6.0–8.3)

## 2021-09-28 LAB — LIPID PANEL
Cholesterol: 151 mg/dL (ref 0–200)
HDL: 31 mg/dL — ABNORMAL LOW (ref >39.00)
LDL Cholesterol: 94 mg/dL (ref 0–99)
NonHDL: 120.27
Total CHOL/HDL Ratio: 5
Triglycerides: 129 mg/dL (ref 0.0–149.0)
VLDL: 25.8 mg/dL (ref 0.0–40.0)

## 2021-09-28 LAB — HEMOGLOBIN A1C: Hgb A1c MFr Bld: 13.6 % — ABNORMAL HIGH (ref 4.6–6.5)

## 2021-09-28 NOTE — Progress Notes (Signed)
Subjective:    Patient ID: Adrienne Thompson, female    DOB: Oct 05, 1975, 46 y.o.   MRN: [id number]  HPI Pt last a1c was 9.8.   08-11-2020 lab result note. "Your sugar at time of lab was 148. Your 3 month blood sugar average was about 232. Improved from before but want you sugar average close to 140. I want you to stay on glipizide and try to increase metformin to twice daily. Check sugars in moring fasting and other time 30 minutes- 1 hour post meal(lunch or dinner). Check twice daily sugar. Write those numbers down. Follow up in 2 weeks:  Pt tells me for some months was off med? She just restarted on Saturday.   Pt was getting DOT exam last Thursday and she was told sugar was to high. She states they saw sugar in urine.  Pt drives a school bus.   Pt is being giving 45.   In the past pt was referred to diabetic educator but she never went.  Htn- on amlodipine 5 mg mg daily , chlorthalidone 25 mg daily and losartan 100 mg daily.  High cholestetol- rx statin last year. Med not started.     Review of Systems  Constitutional:  Negative for chills, fatigue and fever.  Respiratory:  Negative for cough, chest tightness, shortness of breath and wheezing.   Cardiovascular:  Negative for chest pain and palpitations.  Gastrointestinal:  Negative for abdominal pain, blood in stool, constipation, nausea and rectal pain.  Musculoskeletal:  Negative for back pain, joint swelling and neck pain.  Skin:  Negative for rash.  Neurological:  Negative for dizziness, light-headedness and headaches.  Hematological:  Negative for adenopathy. Does not bruise/bleed easily.  Psychiatric/Behavioral:  Negative for behavioral problems and decreased concentration.     Past Medical History:  Diagnosis Date   Diabetes mellitus without complication (HCC)    Dyslipidemia    Hypertension      Social History   Socioeconomic History   Marital status: Single    Spouse name: Not on file   Number of children:  Not on file   Years of education: Not on file   Highest education level: Not on file  Occupational History   Not on file  Tobacco Use   Smoking status: Never   Smokeless tobacco: Never  Substance and Sexual Activity   Alcohol use: Never   Drug use: Never   Sexual activity: Not on file  Other Topics Concern   Not on file  Social History Narrative   Not on file   Social Determinants of Health   Financial Resource Strain: Not on file  Food Insecurity: Not on file  Transportation Needs: Not on file  Physical Activity: Not on file  Stress: Not on file  Social Connections: Not on file  Intimate Partner Violence: Not on file    No past surgical history on file.  Family History  Problem Relation Age of Onset   Hypertension Mother    Diabetes Mother     No Known Allergies  Current Outpatient Medications on File Prior to Visit  Medication Sig Dispense Refill   amLODipine (NORVASC) 5 MG tablet Take 1 tablet (5 mg total) by mouth daily. 90 tablet 3   chlorthalidone (HYGROTON) 25 MG tablet Take 1 tablet (25 mg total) by mouth daily. 90 tablet 3   glipiZIDE (GLUCOTROL) 5 MG tablet Take 2 tablets (10 mg total) by mouth 2 (two) times daily with a meal. 120 tablet 3  metFORMIN (GLUCOPHAGE) 500 MG tablet Take 1 tablet (500 mg total) by mouth 2 (two) times daily with a meal. 60 tablet 0   losartan (COZAAR) 100 MG tablet Take 1 tablet (100 mg total) by mouth daily. (Patient not taking: Reported on 09/28/2021) 90 tablet 3   No current facility-administered medications on file prior to visit.    BP 138/84   Pulse 96   Temp 98.3 F (36.8 C) (Oral)   Ht 5\' 6"  (1.676 m)   Wt 206 lb 6.4 oz (93.6 kg)   LMP 09/01/2021   SpO2 100%   BMI 33.31 kg/m        Objective:   Physical Exam   General Mental Status- Alert. General Appearance- Not in acute distress.   Skin General: Color- Normal Color. Moisture- Normal Moisture.  Neck Carotid Arteries- Normal color. Moisture- Normal  Moisture. No carotid bruits. No JVD.  Chest and Lung Exam Auscultation: Breath Sounds:-Normal.  Cardiovascular Auscultation:Rythm- Regular. Murmurs & Other Heart Sounds:Auscultation of the heart reveals- No Murmurs.  Abdomen Inspection:-Inspeection Normal. Palpation/Percussion:Note:No mass. Palpation and Percussion of the abdomen reveal- Non Tender, Non Distended + BS, no rebound or guarding.   Neurologic Cranial Nerve exam:- CN III-XII intact(No nystagmus), symmetric smile. Strength:- 5/5 equal and symmetric strength both upper and lower extremities.      Assessment & Plan:   Patient Instructions  Hyperlipidemia- will repeat lipid panel today. After review will likely start low dose statin to reduce cardiovascular risk as discussed.  Htn- bp controlled amlodipine 5 mg mg daily , chlorthalidone 25 mg daily and losartan 100 mg daily.  Diaebest- high a1c last year. Recent high sugar when you check at home and sugar in urine on dot exam. You have restarted glipizide and metformin. Placed referral for diabetic education. Will get a1c and cmp today.  Follow up 3 months or sooner if needed.   09/03/2021, PA-C

## 2021-09-28 NOTE — Patient Instructions (Addendum)
Hyperlipidemia- will repeat lipid panel today. After review will likely start low dose statin to reduce cardiovascular risk as discussed.  Htn- bp controlled amlodipine 5 mg mg daily , chlorthalidone 25 mg daily and losartan 100 mg daily.  Diaebest- high a1c last year. Recent high sugar when you check at home and sugar in urine on dot exam. You have restarted glipizide and metformin. Placed referral for diabetic education. Will get a1c and cmp today.  Follow up 3 months or sooner if needed.

## 2021-09-29 ENCOUNTER — Encounter (HOSPITAL_BASED_OUTPATIENT_CLINIC_OR_DEPARTMENT_OTHER): Payer: Self-pay

## 2021-09-29 ENCOUNTER — Inpatient Hospital Stay (HOSPITAL_BASED_OUTPATIENT_CLINIC_OR_DEPARTMENT_OTHER)
Admission: EM | Admit: 2021-09-29 | Discharge: 2021-10-01 | DRG: 683 | Disposition: A | Discharge status: Home / Self Care | Payer: BC Managed Care – PPO | Source: Ambulatory Visit | Attending: Internal Medicine | Admitting: Internal Medicine

## 2021-09-29 ENCOUNTER — Inpatient Hospital Stay (HOSPITAL_COMMUNITY): Payer: BC Managed Care – PPO

## 2021-09-29 ENCOUNTER — Other Ambulatory Visit: Payer: Self-pay

## 2021-09-29 ENCOUNTER — Encounter (HOSPITAL_COMMUNITY): Payer: Self-pay

## 2021-09-29 DIAGNOSIS — Z833 Family history of diabetes mellitus: Secondary | ICD-10-CM | POA: Diagnosis not present

## 2021-09-29 DIAGNOSIS — Z79899 Other long term (current) drug therapy: Secondary | ICD-10-CM

## 2021-09-29 DIAGNOSIS — E1165 Type 2 diabetes mellitus with hyperglycemia: Secondary | ICD-10-CM | POA: Diagnosis present

## 2021-09-29 DIAGNOSIS — I10 Essential (primary) hypertension: Secondary | ICD-10-CM | POA: Diagnosis present

## 2021-09-29 DIAGNOSIS — N179 Acute kidney failure, unspecified: Secondary | ICD-10-CM | POA: Diagnosis present

## 2021-09-29 DIAGNOSIS — E871 Hypo-osmolality and hyponatremia: Secondary | ICD-10-CM | POA: Diagnosis present

## 2021-09-29 DIAGNOSIS — Z6833 Body mass index (BMI) 33.0-33.9, adult: Secondary | ICD-10-CM

## 2021-09-29 DIAGNOSIS — Z7984 Long term (current) use of oral hypoglycemic drugs: Secondary | ICD-10-CM | POA: Diagnosis not present

## 2021-09-29 DIAGNOSIS — E669 Obesity, unspecified: Secondary | ICD-10-CM | POA: Diagnosis present

## 2021-09-29 DIAGNOSIS — Z8249 Family history of ischemic heart disease and other diseases of the circulatory system: Secondary | ICD-10-CM | POA: Diagnosis not present

## 2021-09-29 DIAGNOSIS — R718 Other abnormality of red blood cells: Secondary | ICD-10-CM | POA: Diagnosis present

## 2021-09-29 DIAGNOSIS — N920 Excessive and frequent menstruation with regular cycle: Secondary | ICD-10-CM | POA: Diagnosis present

## 2021-09-29 DIAGNOSIS — E861 Hypovolemia: Secondary | ICD-10-CM | POA: Diagnosis present

## 2021-09-29 DIAGNOSIS — D649 Anemia, unspecified: Secondary | ICD-10-CM

## 2021-09-29 DIAGNOSIS — E1142 Type 2 diabetes mellitus with diabetic polyneuropathy: Secondary | ICD-10-CM

## 2021-09-29 DIAGNOSIS — E876 Hypokalemia: Secondary | ICD-10-CM | POA: Diagnosis present

## 2021-09-29 DIAGNOSIS — E785 Hyperlipidemia, unspecified: Secondary | ICD-10-CM | POA: Diagnosis present

## 2021-09-29 DIAGNOSIS — D500 Iron deficiency anemia secondary to blood loss (chronic): Secondary | ICD-10-CM | POA: Diagnosis present

## 2021-09-29 DIAGNOSIS — D72829 Elevated white blood cell count, unspecified: Secondary | ICD-10-CM | POA: Diagnosis present

## 2021-09-29 HISTORY — DX: Anemia, unspecified: D64.9

## 2021-09-29 LAB — CBC
HCT: 30.9 % — ABNORMAL LOW (ref 36.0–46.0)
Hemoglobin: 9.5 g/dL — ABNORMAL LOW (ref 12.0–15.0)
MCH: 20.7 pg — ABNORMAL LOW (ref 26.0–34.0)
MCHC: 30.7 g/dL (ref 30.0–36.0)
MCV: 67.2 fL — ABNORMAL LOW (ref 80.0–100.0)
Platelets: 613 10*3/uL — ABNORMAL HIGH (ref 150–400)
RBC: 4.6 MIL/uL (ref 3.87–5.11)
RDW: 16.7 % — ABNORMAL HIGH (ref 11.5–15.5)
WBC: 13 10*3/uL — ABNORMAL HIGH (ref 4.0–10.5)
nRBC: 0 % (ref 0.0–0.2)

## 2021-09-29 LAB — URINALYSIS, ROUTINE W REFLEX MICROSCOPIC
Glucose, UA: NEGATIVE mg/dL
Ketones, ur: NEGATIVE mg/dL
Leukocytes,Ua: NEGATIVE
Nitrite: NEGATIVE
Protein, ur: NEGATIVE mg/dL
Specific Gravity, Urine: 1.02 (ref 1.005–1.030)
pH: 5 (ref 5.0–8.0)

## 2021-09-29 LAB — BASIC METABOLIC PANEL
Anion gap: 15 (ref 5–15)
BUN: 41 mg/dL — ABNORMAL HIGH (ref 6–20)
CO2: 21 mmol/L — ABNORMAL LOW (ref 22–32)
Calcium: 8.9 mg/dL (ref 8.9–10.3)
Chloride: 90 mmol/L — ABNORMAL LOW (ref 98–111)
Creatinine, Ser: 4.15 mg/dL — ABNORMAL HIGH (ref 0.44–1.00)
GFR, Estimated: 13 mL/min — ABNORMAL LOW (ref >60)
Glucose, Bld: 208 mg/dL — ABNORMAL HIGH (ref 70–99)
Potassium: 3.4 mmol/L — ABNORMAL LOW (ref 3.5–5.1)
Sodium: 126 mmol/L — ABNORMAL LOW (ref 135–145)

## 2021-09-29 LAB — CBG MONITORING, ED
Glucose-Capillary: 194 mg/dL — ABNORMAL HIGH (ref 70–99)
Glucose-Capillary: 150 mg/dL — ABNORMAL HIGH (ref 70–99)

## 2021-09-29 LAB — URINALYSIS, MICROSCOPIC (REFLEX)

## 2021-09-29 LAB — GLUCOSE, CAPILLARY: Glucose-Capillary: 127 mg/dL — ABNORMAL HIGH (ref 70–99)

## 2021-09-29 LAB — PREGNANCY, URINE: Preg Test, Ur: NEGATIVE

## 2021-09-29 MED ORDER — HEPARIN SODIUM (PORCINE) 5000 UNIT/ML IJ SOLN
5000.0000 [IU] | Freq: Three times a day (TID) | INTRAMUSCULAR | Status: DC
  Administered 2021-09-29 – 2021-10-01 (×5): 5000 [IU] via SUBCUTANEOUS
  Filled 2021-09-29 (×6): qty 1

## 2021-09-29 MED ORDER — LACTATED RINGERS IV SOLN: INTRAVENOUS | Status: DC

## 2021-09-29 MED ORDER — SODIUM CHLORIDE 0.9 % IV BOLUS
500.0000 mL | Freq: Once | INTRAVENOUS | Status: AC
  Administered 2021-09-29: 500 mL via INTRAVENOUS

## 2021-09-29 MED ORDER — ONDANSETRON HCL 4 MG/2ML IJ SOLN
4.0000 mg | Freq: Once | INTRAMUSCULAR | Status: AC
  Administered 2021-09-29: 4 mg via INTRAVENOUS
  Filled 2021-09-29: qty 2

## 2021-09-29 MED ORDER — SODIUM CHLORIDE 0.9 % IV SOLN: Freq: Once | INTRAVENOUS | Status: AC

## 2021-09-29 MED ORDER — POTASSIUM CHLORIDE CRYS ER 20 MEQ PO TBCR
40.0000 meq | EXTENDED_RELEASE_TABLET | Freq: Once | ORAL | Status: AC
  Administered 2021-09-29: 40 meq via ORAL
  Filled 2021-09-29: qty 2

## 2021-09-29 MED ORDER — INSULIN ASPART 100 UNIT/ML IJ SOLN
0.0000 [IU] | Freq: Three times a day (TID) | INTRAMUSCULAR | Status: DC
  Administered 2021-09-30 (×3): 3 [IU] via SUBCUTANEOUS
  Administered 2021-09-30: 8 [IU] via SUBCUTANEOUS
  Administered 2021-10-01: 2 [IU] via SUBCUTANEOUS

## 2021-09-29 NOTE — H&P (Signed)
History and Physical    Patient: Adrienne Thompson OEV:[id number] DOB: 08/04/1975 DOA: 09/29/2021 DOS: the patient was seen and examined on 09/29/2021 PCP: Esperanza Richters, PA-C  Patient coming from: Home-outside ED [facility]   Chief Complaint:  Chief Complaint  Patient presents with   Abnormal Lab   HPI: Adrienne Thompson is a 46 y.o. female with medical history significant of HTN, Type 2 diabetes who presents from outside ED with AKI.   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  She was started on metformin and glipizide.  2 days following the start of her medication she developed persistent nausea and vomiting.  Had subjective fever.  No abdominal pain or diarrhea.  Also reports being placed on antibiotic last week for vaginal issues.  She has since discontinued these medications.  In the ED, she was afebrile normotensive on room air.  Had leukocytosis of 13, hemoglobin 9.5.  Hyponatremia of 126, potassium 3.4, chloride of 90, CO2 21, AKI with creatinine of 4.15 compared to 0.79 back in November 2022. UA is negative.  Urine pregnancy test is negative.  Review of Systems: As mentioned in the history of present illness. All other systems reviewed and are negative. Past Medical History:  Diagnosis Date   Anemia 09/29/2021   Diabetes mellitus without complication (HCC)    Dyslipidemia    Hypertension    History reviewed. No pertinent surgical history. Social History:  reports that she has never smoked. She has never used smokeless tobacco. She reports that she does not drink alcohol and does not use drugs.  No Known Allergies  Family History  Problem Relation Age of Onset   Hypertension Mother    Diabetes Mother     Prior to Admission medications   Medication Sig Start Date End Date Taking? Authorizing Provider  amLODipine (NORVASC) 5 MG tablet Take 1 tablet (5 mg total) by mouth daily. 08/11/20   Saguier, Ramon Dredge, PA-C   chlorthalidone (HYGROTON) 25 MG tablet Take 1 tablet (25 mg total) by mouth daily. 12/30/19   Saguier, Ramon Dredge, PA-C  glipiZIDE (GLUCOTROL) 5 MG tablet Take 2 tablets (10 mg total) by mouth 2 (two) times daily with a meal. 09/23/21   Saguier, Ramon Dredge, PA-C  losartan (COZAAR) 100 MG tablet Take 1 tablet (100 mg total) by mouth daily. Patient not taking: Reported on 09/28/2021 12/18/19   Saguier, Ramon Dredge, PA-C  metFORMIN (GLUCOPHAGE) 500 MG tablet Take 1 tablet (500 mg total) by mouth 2 (two) times daily with a meal. 09/23/21   Saguier, Ramon Dredge, PA-C    Physical Exam: Vitals:   09/29/21 1345 09/29/21 1600 09/29/21 1638 09/29/21 2040  BP: 120/85 (!) 100/55 130/79 108/66  Pulse: 83 75 75 73  Resp: 18 15 14 18   Temp:  98.8 F (37.1 C)  98.2 F (36.8 C)  TempSrc:      SpO2: 97% 100% 99% 100%  Weight:      Height:       Constitutional: NAD, calm, comfortable, well-appearing obese female sitting upright in bed Eyes: lids and conjunctivae normal ENMT: Mucous membranes are moist.  Neck: normal, supple, Respiratory: clear to auscultation bilaterally, no wheezing, no crackles. Normal respiratory effort.  Cardiovascular: Regular rate and rhythm, no murmurs / rubs / gallops. No extremity edema.  Abdomen: Soft, nontender nondistended.  No mass palpated.  Bowel sounds positive.  Musculoskeletal: no clubbing / cyanosis. No joint deformity upper and lower extremities. Good ROM, no contractures.  Normal muscle tone.  Skin: no rashes, lesions, ulcers. No induration Neurologic: CN 2-12 grossly intact.  Strength 5/5 in all 4.  Psychiatric: Normal judgment and insight. Alert and oriented x 3.  Appears to be dazed and staring out to distance frequently. Data Reviewed:  See HPI  Assessment and Plan: * AKI (acute kidney injury) (HCC) -creatinine of 4.15 compared to 0.79 back in November 2022 -pre-renal due to GI symptoms which are likely due to intolerance to recently started metformin -renal ultrasound was  negative -continue IV LR fluid overnight and follow repeat creatinine in the morning -avoid contrasted study and nephrotoxic agents -hold home Losartan and Chlorthalidone    Anemia Hgb of 9.5 with MCV of 67 -pt still has monthly menstrual cycles and is heavy for a few days  -check iron panel  Hypokalemia K of 3.4. Replete with oral K supplement  Hyponatremia Na of 126 due to hypovolemia -continue IV LR overnight and follow Na in the morning  Essential hypertension Holding home ACE and diuretic due to AKI  Type 2 diabetes mellitus with hyperglycemia, without long-term current use of insulin (HCC) Uncontrolled with HbA1C of 13.6.  -intolerant to metformin and will need to discontinue -keep on moderate SSI with meals and qHS  -consulted dietician for diabetic education   Obesity  BMI of 33   Advance Care Planning:   Code Status: Full Code   Consults: none  Family Communication: none at bedside  Severity of Illness: The appropriate patient status for this patient is INPATIENT. Inpatient status is judged to be reasonable and necessary in order to provide the required intensity of service to ensure the patient's safety. The patient's presenting symptoms, physical exam findings, and initial radiographic and laboratory data in the context of their chronic comorbidities is felt to place them at high risk for further clinical deterioration. Furthermore, it is not anticipated that the patient will be medically stable for discharge from the hospital within 2 midnights of admission.   * I certify that at the point of admission it is my clinical judgment that the patient will require inpatient hospital care spanning beyond 2 midnights from the point of admission due to high intensity of service, high risk for further deterioration and high frequency of surveillance required.*  Author: Anselm Jungling, DO 09/29/2021 10:52 PM  For on call review [email or web address].

## 2021-09-29 NOTE — Assessment & Plan Note (Signed)
Holding home ACE and diuretic due to AKI

## 2021-09-29 NOTE — Assessment & Plan Note (Signed)
-  creatinine of 4.15 compared to 0.79 back in November 2022 -pre-renal due to GI symptoms which are likely due to intolerance to recently started metformin -renal ultrasound was negative -continue IV LR fluid overnight and follow repeat creatinine in the morning -avoid contrasted study and nephrotoxic agents -hold home Losartan and Chlorthalidone

## 2021-09-29 NOTE — Assessment & Plan Note (Signed)
Hgb of 9.5 with MCV of 67 -pt still has monthly menstrual cycles and is heavy for a few days  -check iron panel

## 2021-09-29 NOTE — ED Triage Notes (Signed)
Takes metformin for diabetes, states was off it for a while and just started taking it Saturday. Sent here by PCP as her kidney function was abnormal.   C/o vomiting

## 2021-09-29 NOTE — Assessment & Plan Note (Signed)
Na of 126 due to hypovolemia -continue IV LR overnight and follow Na in the morning

## 2021-09-29 NOTE — ED Notes (Signed)
Pt provided chicken soup and water, per her request. EDP Rancour aware and agreeable.    Pt with request to speak with MD regarding admission, EDP Rancour made aware.

## 2021-09-29 NOTE — ED Provider Notes (Signed)
[facility] EMERGENCY DEPARTMENT Provider Note   CSN: [id number] Arrival date & time: 09/29/21  1149     History  Chief Complaint  Patient presents with   Abnormal Lab    Adrienne Thompson is a 46 y.o. female.  HPI Patient reports that she has been following up with her primary care physician for medication management.  She had not been seen for about a year.  Her blood sugars were elevated.  She had not been taking diabetes medications for the year.  She reports she was compliant over the year with her losartan and chlorthalidone but was not having any monitoring of her blood work.  Patient reports that she has been having some slight dizziness over the past several days as well she has been having some nausea and difficulty eating and drinking.  She reports she has vomited some small amounts occasionally and for couple of days gets nauseated very easily.  She denies she is having chest pain or headache.  No acute visual changes.  Patient did her follow-up appointment, her kidney function number had increased significantly since last year and and she was referred to the emergency department for further management.  Patient denies she is having problems with chest pain or shortness of breath.    Home Medications Prior to Admission medications   Medication Sig Start Date End Date Taking? Authorizing Provider  amLODipine (NORVASC) 5 MG tablet Take 1 tablet (5 mg total) by mouth daily. 08/11/20   Saguier, Ramon Dredge, PA-C  chlorthalidone (HYGROTON) 25 MG tablet Take 1 tablet (25 mg total) by mouth daily. 12/30/19   Saguier, Ramon Dredge, PA-C  glipiZIDE (GLUCOTROL) 5 MG tablet Take 2 tablets (10 mg total) by mouth 2 (two) times daily with a meal. 09/23/21   Saguier, Ramon Dredge, PA-C  losartan (COZAAR) 100 MG tablet Take 1 tablet (100 mg total) by mouth daily. Patient not taking: Reported on 09/28/2021 12/18/19   Saguier, Ramon Dredge, PA-C  metFORMIN (GLUCOPHAGE) 500 MG tablet Take 1 tablet (500 mg total) by  mouth 2 (two) times daily with a meal. 09/23/21   Saguier, Ramon Dredge, PA-C      Allergies    Patient has no known allergies.    Review of Systems   Review of Systems 10 systems reviewed negative except as per HPI Physical Exam Updated Vital Signs BP 120/85   Pulse 83   Temp 98.1 F (36.7 C) (Oral)   Resp 18   Ht 5\' 6"  (1.676 m)   Wt 93.9 kg   LMP 09/01/2021   SpO2 97%   BMI 33.41 kg/m  Physical Exam Constitutional:      Appearance: Normal appearance.  HENT:     Head: Normocephalic and atraumatic.     Mouth/Throat:     Pharynx: Oropharynx is clear.  Eyes:     Extraocular Movements: Extraocular movements intact.     Conjunctiva/sclera: Conjunctivae normal.     Pupils: Pupils are equal, round, and reactive to light.  Cardiovascular:     Rate and Rhythm: Normal rate and regular rhythm.  Pulmonary:     Effort: Pulmonary effort is normal.     Breath sounds: Normal breath sounds.  Abdominal:     General: There is no distension.     Palpations: Abdomen is soft.     Tenderness: There is no abdominal tenderness. There is no guarding.  Musculoskeletal:        General: No swelling or tenderness. Normal range of motion.     Cervical back:  Neck supple.     Right lower leg: No edema.     Left lower leg: No edema.  Skin:    General: Skin is warm and dry.  Neurological:     General: No focal deficit present.     Mental Status: She is alert and oriented to person, place, and time.     Coordination: Coordination normal.  Psychiatric:        Mood and Affect: Mood normal.     ED Results / Procedures / Treatments   Labs (all labs ordered are listed, but only abnormal results are displayed) Labs Reviewed  BASIC METABOLIC PANEL - Abnormal; Notable for the following components:      Result Value   Sodium 126 (*)    Potassium 3.4 (*)    Chloride 90 (*)    CO2 21 (*)    Glucose, Bld 208 (*)    BUN 41 (*)    Creatinine, Ser 4.15 (*)    GFR, Estimated 13 (*)    All other  components within normal limits  CBC - Abnormal; Notable for the following components:   WBC 13.0 (*)    Hemoglobin 9.5 (*)    HCT 30.9 (*)    MCV 67.2 (*)    MCH 20.7 (*)    RDW 16.7 (*)    Platelets 613 (*)    All other components within normal limits  URINALYSIS, ROUTINE W REFLEX MICROSCOPIC - Abnormal; Notable for the following components:   APPearance HAZY (*)    Hgb urine dipstick TRACE (*)    Bilirubin Urine SMALL (*)    All other components within normal limits  URINALYSIS, MICROSCOPIC (REFLEX) - Abnormal; Notable for the following components:   Bacteria, UA MANY (*)    All other components within normal limits  CBG MONITORING, ED - Abnormal; Notable for the following components:   Glucose-Capillary 194 (*)    All other components within normal limits  PREGNANCY, URINE  CBG MONITORING, ED  CBG MONITORING, ED    EKG EKG Interpretation  Date/Time:  Thursday September 29 2021 14:36:53 EDT Ventricular Rate:  78 PR Interval:  173 QRS Duration: 86 QT Interval:  381 QTC Calculation: 434 R Axis:   6 Text Interpretation: Sinus rhythm Left ventricular hypertrophy agree, early repolarization. no STEMI no old comparison Confirmed by Arby Barrette [phone]) on 09/29/2021 3:44:59 PM  Radiology No results found.  Procedures Procedures    Medications Ordered in ED Medications  0.9 %  sodium chloride infusion ( Intravenous New Bag/Given 09/29/21 1450)  sodium chloride 0.9 % bolus 500 mL (500 mLs Intravenous New Bag/Given 09/29/21 1500)  ondansetron (ZOFRAN) injection 4 mg (4 mg Intravenous Given 09/29/21 1458)    ED Course/ Medical Decision Making/ A&P Clinical Course as of 09/29/21 1545  Thu Sep 29, 2021  1430 BUN(!): 41 [MP]  1430 Basic metabolic panel(!) [MP]    Clinical Course User Index [MP] Arby Barrette, MD                           Medical Decision Making Amount and/or Complexity of Data Reviewed Labs: ordered. Decision-making details documented in ED  Course.  Risk Prescription drug management. Decision regarding hospitalization.   Patient is referred from PCP office after identification of significant AKI from baseline (creatinine 4.15 and hyponatremia.  Patient is alert.  She does not have respiratory distress at rest.  Patient does at baseline have type 2 diabetes.  She has not been taking medication over the past year.  She has just recently been restarted on metformin and glipizide but was compliant with antihypertensive medications for the past year.  Now over the next several days patient has experienced some nausea and decreased oral intake.  Patient is hyponatremic at 126 and has significant acute kidney injury.  Will initiate fluid resuscitation with normal saline.  Patient's mental status is clear.  No signs of encephalopathy.  With significant and new kidney injury, will plan for admission.  Consult: Dr. Marland Mcalpine accepts for admission.        Final Clinical Impression(s) / ED Diagnoses Final diagnoses:  Acute renal failure, unspecified acute renal failure type (HCC)  Hyponatremia    Rx / DC Orders ED Discharge Orders     None         Arby Barrette, MD 09/29/21 1547

## 2021-09-29 NOTE — Assessment & Plan Note (Signed)
K of 3.4. Replete with oral K supplement

## 2021-09-29 NOTE — Assessment & Plan Note (Signed)
Uncontrolled with HbA1C of 13.6.  -intolerant to metformin and will need to discontinue -keep on moderate SSI with meals and qHS  -consulted dietician for diabetic education

## 2021-09-29 NOTE — Plan of Care (Signed)
[facility] -> [facility]  Transfer Request from Dr. Arby Barrette  Patient is a 46 year old African-American female with a past medical history significant for but limited to hypertension, diabetes mellitus type 2 who not been seen in about a year.  She was noted to have elevated blood sugars at her PCP office and they did further workup and her hemoglobin A1c was 13.6.  She had basic blood work done and was found to have a significant AKI.  She has been compliant with her losartan and chlorthalidone but over the past several days has some nausea and vomiting and difficulty eating and drinking.  States that she vomits a small amount and gets nauseated very easily.  Denies any other concerns or changes and needs to be on metformin and glipizide which had stopped taking but was recently restarted on.  Given her significant AKI she will need further renal workup with a renal ultrasound.  EDP is giving her maintenance IV fluids and will give her boluses and also check a UDS.  She is Accepted to Inpatient Telemetry given her significant AKI and uncontrolled diabetes mellitus type 2.  If not improving will need nephrology consultation.

## 2021-09-29 NOTE — ED Notes (Signed)
Patient is alert x4 . Denies any issues  today.

## 2021-09-30 DIAGNOSIS — N179 Acute kidney failure, unspecified: Secondary | ICD-10-CM | POA: Diagnosis not present

## 2021-09-30 LAB — BASIC METABOLIC PANEL
Anion gap: 14 (ref 5–15)
BUN: 39 mg/dL — ABNORMAL HIGH (ref 6–20)
CO2: 21 mmol/L — ABNORMAL LOW (ref 22–32)
Calcium: 9.1 mg/dL (ref 8.9–10.3)
Chloride: 101 mmol/L (ref 98–111)
Creatinine, Ser: 2.83 mg/dL — ABNORMAL HIGH (ref 0.44–1.00)
GFR, Estimated: 20 mL/min — ABNORMAL LOW (ref >60)
Glucose, Bld: 153 mg/dL — ABNORMAL HIGH (ref 70–99)
Potassium: 3.8 mmol/L (ref 3.5–5.1)
Sodium: 136 mmol/L (ref 135–145)

## 2021-09-30 LAB — IRON AND TIBC
Iron: 13 ug/dL — ABNORMAL LOW (ref 28–170)
Saturation Ratios: 3 % — ABNORMAL LOW (ref 10.4–31.8)
TIBC: 398 ug/dL (ref 250–450)
UIBC: 385 ug/dL

## 2021-09-30 LAB — CBC
HCT: 28.4 % — ABNORMAL LOW (ref 36.0–46.0)
Hemoglobin: 8.5 g/dL — ABNORMAL LOW (ref 12.0–15.0)
MCH: 20.8 pg — ABNORMAL LOW (ref 26.0–34.0)
MCHC: 29.9 g/dL — ABNORMAL LOW (ref 30.0–36.0)
MCV: 69.6 fL — ABNORMAL LOW (ref 80.0–100.0)
Platelets: 566 10*3/uL — ABNORMAL HIGH (ref 150–400)
RBC: 4.08 MIL/uL (ref 3.87–5.11)
RDW: 16.9 % — ABNORMAL HIGH (ref 11.5–15.5)
WBC: 10.5 10*3/uL (ref 4.0–10.5)
nRBC: 0 % (ref 0.0–0.2)

## 2021-09-30 LAB — GLUCOSE, CAPILLARY
Glucose-Capillary: 158 mg/dL — ABNORMAL HIGH (ref 70–99)
Glucose-Capillary: 204 mg/dL — ABNORMAL HIGH (ref 70–99)
Glucose-Capillary: 189 mg/dL — ABNORMAL HIGH (ref 70–99)
Glucose-Capillary: 251 mg/dL — ABNORMAL HIGH (ref 70–99)

## 2021-09-30 MED ORDER — FERROUS SULFATE 325 (65 FE) MG PO TABS
325.0000 mg | ORAL_TABLET | Freq: Every day | ORAL | Status: DC
Start: 2021-10-01 — End: 2021-10-01
  Administered 2021-10-01: 325 mg via ORAL
  Filled 2021-09-30: qty 1

## 2021-09-30 MED ORDER — SODIUM CHLORIDE 0.9 % IV SOLN
510.0000 mg | Freq: Once | INTRAVENOUS | Status: AC
  Administered 2021-09-30: 510 mg via INTRAVENOUS
  Filled 2021-09-30: qty 17

## 2021-09-30 MED ORDER — LIVING WELL WITH DIABETES BOOK
Freq: Once | Status: AC
Start: 2021-09-30 — End: 2021-09-30
  Filled 2021-09-30 (×2): qty 1

## 2021-09-30 MED ORDER — SODIUM CHLORIDE 0.9 % IV SOLN: Freq: Once | INTRAVENOUS | Status: DC

## 2021-09-30 MED ORDER — INSULIN STARTER KIT- PEN NEEDLES (ENGLISH)
1.0000 | Freq: Once | Status: AC
Start: 2021-09-30 — End: 2021-09-30
  Administered 2021-09-30: 1
  Filled 2021-09-30 (×2): qty 1

## 2021-09-30 MED ORDER — SODIUM CHLORIDE 0.9 % IV SOLN: INTRAVENOUS | Status: DC

## 2021-09-30 NOTE — Progress Notes (Addendum)
PROGRESS NOTE  Adrienne Thompson  AST:[id number] DOB: December 01, 1975 DOA: 09/29/2021 PCP: Esperanza Richters, PA-C   Brief Narrative:  Patient is a 46 year old female with history of hypertension, diabetes type 2 who presents to [place] from the outside emergency department after further evaluation of abnormal labs.  Patient drives a schoolbus.  She was found to have uncontrolled diabetes with hemoglobin A1c of 13.6 recently and was started on metformin and glipizide.  She has been diagnosed with diabetes years ago but she was noncompliant and not taking medications.  After restarting the medication, she started having nausea and vomiting.  No history of abdomen pain or diarrhea.  Patient also reported taking antibiotics last week for vaginal issues.  On presentation, lab work showed leukocytosis, hyponatremia, hypokalemia, acute kidney injury with creatinine of 4.1(baseline creatinine is normal).  Patient admitted for the management of severe AKI, hyponatremia.  Assessment & Plan:  Principal Problem:   AKI (acute kidney injury) (HCC) Active Problems:   Type 2 diabetes mellitus with hyperglycemia, without long-term current use of insulin (HCC)   Essential hypertension   Hyponatremia   Hypokalemia   Anemia   AKI: Likely secondary to volume loss from vomiting.  Baseline kidney function normal.  Renal ultrasound was normal, no obstruction seen.  Started on IV fluids which we will continue.  Avoid nephrotoxic agents.  Home losartan, chlorthalidone on hold.  Hyponatremia: Again this could be hypovolemic hyponatremia from volume loss.  Expect improvement with IV fluids.  Hypokalemia: Currently being supplemented and monitor.  Hypertension: Currently normotensive.  Home medications on hold  Uncontrolled diabetes type 2: Recently started on metformin, glipizide.  Hemoglobin A1c of 13.6.  History of noncompliance and stopped the medications by herself previously.   patient ideally needs to be on insulin.  Diabetic  coronary consulted.  Continue sliding-scale insulin for now.  Monitor blood sugars  Microcytic anemia: Hemoglobin of 8-9 with microcytosis.  Patient gives history of menorrhagia.  Iron studies showed low iron of 13.  Will give her dose of IV iron.  Will continue oral supplementation on discharge.  Obesity: BMI 33.4       DVT prophylaxis:heparin injection 5,000 Units Start: 09/29/21 2315     Code Status: Full Code  Family Communication: None at bedside  Patient status:Inpatient  Patient is from :Home  Anticipated discharge LG:[id number]  Estimated DC date:1-2 days   Consultants: None  Procedures:None  Antimicrobials:  Anti-infectives (From admission, onward)    None       Subjective: Patient seen and examined at the bedside this morning.  Hemodynamically stable without any complaints today.  Objective: Vitals:   09/29/21 1638 09/29/21 2040 09/30/21 0048 09/30/21 0425  BP: 130/79 108/66 113/67 (!) 118/57  Pulse: 75 73 67 69  Resp: 14 18 17 18   Temp:  98.2 F (36.8 C)    TempSrc:      SpO2: 99% 100% 98% 99%  Weight:      Height:        Intake/Output Summary (Last 24 hours) at 09/30/2021 0732 Last data filed at 09/29/2021 1614 Gross per 24 hour  Intake 610.81 ml  Output --  Net 610.81 ml   Filed Weights   09/29/21 1203  Weight: 93.9 kg    Examination:  General exam: Overall comfortable, not in distress, obese HEENT: PERRL Respiratory system:  no wheezes or crackles  Cardiovascular system: S1 & S2 heard, RRR.  Gastrointestinal system: Abdomen is nondistended, soft and nontender. Central nervous system: Alert and oriented Extremities: No  edema, no clubbing ,no cyanosis Skin: No rashes, no ulcers,no icterus     Data Reviewed: I have personally reviewed following labs and imaging studies  CBC: Recent Labs  Lab 09/29/21 1220  WBC 13.0*  HGB 9.5*  HCT 30.9*  MCV 67.2*  PLT 613*   Basic Metabolic Panel: Recent Labs  Lab 09/28/21 1023  09/29/21 1220  NA 123* 126*  K 3.8 3.4*  CL 85* 90*  CO2 24 21*  GLUCOSE 119* 208*  BUN 29* 41*  CREATININE 3.26* 4.15*  CALCIUM 9.6 8.9     No results found for this or any previous visit (from the past 240 hour(s)).   Radiology Studies: US RENAL  Result Date: 09/29/2021 CLINICAL DATA:  Acute renal insufficiency EXAM: RENAL / URINARY TRACT ULTRASOUND COMPLETE COMPARISON:  01/12/2021 FINDINGS: Right Kidney: Renal measurements: 12.6 x 5.3 x 6.6 cm = volume: 227.6 mL. Echogenicity within normal limits. No mass or hydronephrosis visualized. Left Kidney: Renal measurements: 11.8 x 5.6 x 6.2 cm = volume: 211.8 ML. Echogenicity within normal limits. No mass or hydronephrosis visualized. Bladder: Appears normal for degree of bladder distention. Other: Incidental note of multiple shadowing gallstones without evidence of cholecystitis. IMPRESSION: 1. Unremarkable renal ultrasound. 2. Incidental cholelithiasis without cholecystitis. Electronically Signed   By: Sharlet Salina M.D.   On: 09/29/2021 21:33    Scheduled Meds:  heparin  5,000 Units Subcutaneous Q8H   insulin aspart  0-15 Units Subcutaneous TID PC & HS   Continuous Infusions:  lactated ringers 75 mL/hr at 09/29/21 2138     LOS: 1 day   Burnadette Pop, MD [facility] P8/12/2021, 7:32 AM

## 2021-09-30 NOTE — Progress Notes (Signed)
Nutrition Note  RD consulted for nutrition education regarding diabetes.   Lab Results  Component Value Date   HGBA1C 13.6 (H) 09/28/2021    RD provided "Carbohydrate Counting for People with Diabetes" handout from the Academy of Nutrition and Dietetics. Discussed different food groups and their effects on blood sugar, emphasizing carbohydrate-containing foods. Provided list of carbohydrates and recommended serving sizes of common foods.  Discussed importance of controlled and consistent carbohydrate intake throughout the day. Provided examples of ways to balance meals/snacks and encouraged intake of high-fiber, whole grain complex carbohydrates. Teach back method used.  Expect fair compliance. Pt's husband present for education and had many questions himself. Pt would likely benefit from outpatient education following discharge. Admitted to feeling overwhelmed by information.  Body mass index is 33.41 kg/m. Pt meets criteria for obesity based on current BMI.  Current diet order is CHO modified, patient is consuming approximately 90% of meals at this time. Labs and medications reviewed. No further nutrition interventions warranted at this time. If additional nutrition issues arise, please re-consult RD.  Tilda Franco, MS, RD, LDN Inpatient Clinical Dietitian Contact information available via Amion

## 2021-09-30 NOTE — Progress Notes (Signed)
Transition of Care [facility]) Screening Note  Patient Details  Name: Adrienne Thompson Date of Birth: [birth year]  [facility] CM/SW Contact:    Ewing Schlein, LCSW Phone Number: 09/30/2021, 9:38 AM  Transition of Care Department [facility]) has reviewed patient and no TOC needs have been identified at this time. We will continue to monitor patient advancement through interdisciplinary progression rounds. If new patient transition needs arise, please place a TOC consult.

## 2021-09-30 NOTE — Inpatient Diabetes Management (Signed)
Inpatient Diabetes Program Recommendations  AACE/ADA: New Consensus Statement on Inpatient Glycemic Control (2015)  Target Ranges:  Prepandial:   less than 140 mg/dL      Peak postprandial:   less than 180 mg/dL (1-2 hours)      Critically ill patients:  140 - 180 mg/dL   Lab Results  Component Value Date   GLUCAP 204 (H) 09/30/2021   HGBA1C 13.6 (H) 09/28/2021    Review of Glycemic Control  Diabetes history: DM2 Outpatient Diabetes medications: glipizide 10 BID, metformin 500 mg BID Current orders for Inpatient glycemic control: Novolog 0-15 units TID  HgbA1C - 13.6% Saw Endo (Dr Kelton Pillar) on 09/13/18 Did not f/u for second appt.  Inpatient Diabetes Program Recommendations:    Add Novolog 70/30 8 units BID  For discharge:  Novolin 70/30 ReliOn 70/30 flexpen 8 units BID  (#[id number])  5 pens for $44 at [facility]  Insulin pen needles (#[id number])  Pt is insulin -naive and will likely need to start with 2 shots/day instead of 4. Pt was concerned about cost, therefore recommend [employer] ReliOn 70/30 insulin.   Will need to f/u with PCP within a week or two and take blood sugar log to appt for MD to review.  Discussed A1C results (13.6%) and explained what an A1C is and informed patient that his current A1C indicates an average glucose of 344 mg/dl over the past 2-3 months. Discussed basic pathophysiology of DM Type 2, basic home care, importance of checking CBGs and maintaining good CBG control to prevent long-term and short-term complications. Reviewed glucose and A1C goals and explained that patient will need to continue to monitor her blood sugars at least 3-4x/day.  Reviewed signs and symptoms of hyperglycemia and hypoglycemia along with treatment for both. Discussed impact of nutrition, exercise, stress, sickness, and medications on diabetes control. Ordered Living Well with diabetes booklet and encouraged patient to read through entire book.  Educated patient on insulin pen use at  home. Reviewed contents of insulin flexpen starter kit. Reviewed all steps if insulin pen including attachment of needle, 2-unit air shot, dialing up dose, giving injection, removing needle, disposal of sharps, storage of unused insulin, disposal of insulin etc. Patient able to provide successful return demonstration. Also reviewed troubleshooting with insulin pen. MD to give patient Rxs for insulin pens and insulin pen needles.  Thank you. Adrienne Thompson, RD, LDN, [place] Inpatient Diabetes Coordinator [phone]

## 2021-10-01 DIAGNOSIS — N179 Acute kidney failure, unspecified: Secondary | ICD-10-CM | POA: Diagnosis not present

## 2021-10-01 LAB — CBC
HCT: 26.6 % — ABNORMAL LOW (ref 36.0–46.0)
Hemoglobin: 8 g/dL — ABNORMAL LOW (ref 12.0–15.0)
MCH: 21.2 pg — ABNORMAL LOW (ref 26.0–34.0)
MCHC: 30.1 g/dL (ref 30.0–36.0)
MCV: 70.6 fL — ABNORMAL LOW (ref 80.0–100.0)
Platelets: 502 10*3/uL — ABNORMAL HIGH (ref 150–400)
RBC: 3.77 MIL/uL — ABNORMAL LOW (ref 3.87–5.11)
RDW: 17 % — ABNORMAL HIGH (ref 11.5–15.5)
WBC: 8 10*3/uL (ref 4.0–10.5)
nRBC: 0 % (ref 0.0–0.2)

## 2021-10-01 LAB — BASIC METABOLIC PANEL
Anion gap: 8 (ref 5–15)
BUN: 34 mg/dL — ABNORMAL HIGH (ref 6–20)
CO2: 22 mmol/L (ref 22–32)
Calcium: 8.4 mg/dL — ABNORMAL LOW (ref 8.9–10.3)
Chloride: 107 mmol/L (ref 98–111)
Creatinine, Ser: 1.37 mg/dL — ABNORMAL HIGH (ref 0.44–1.00)
GFR, Estimated: 48 mL/min — ABNORMAL LOW (ref >60)
Glucose, Bld: 154 mg/dL — ABNORMAL HIGH (ref 70–99)
Potassium: 3.7 mmol/L (ref 3.5–5.1)
Sodium: 137 mmol/L (ref 135–145)

## 2021-10-01 LAB — GLUCOSE, CAPILLARY: Glucose-Capillary: 150 mg/dL — ABNORMAL HIGH (ref 70–99)

## 2021-10-01 MED ORDER — NOVOLIN 70/30 FLEXPEN RELION (70-30) 100 UNIT/ML ~~LOC~~ SUPN: 8.0000 [IU] | PEN_INJECTOR | Freq: Two times a day (BID) | SUBCUTANEOUS | 0 refills | Status: DC

## 2021-10-01 MED ORDER — "PEN NEEDLES 3/16"" 31G X 5 MM MISC": 1.0000 | Freq: Two times a day (BID) | 1 refills | Status: DC

## 2021-10-01 MED ORDER — FERROUS SULFATE 325 (65 FE) MG PO TABS: 325.0000 mg | ORAL_TABLET | Freq: Every day | ORAL | 1 refills | Status: AC

## 2021-10-01 NOTE — Plan of Care (Signed)
  Problem: Coping: Goal: Level of anxiety will decrease 10/01/2021 0036 by Kizzie Bane, RN Outcome: Progressing 10/01/2021 0012 by Kizzie Bane, RN Outcome: Progressing   Problem: Pain Managment: Goal: General experience of comfort will improve 10/01/2021 0036 by Kizzie Bane, RN Outcome: Progressing 10/01/2021 0012 by Kizzie Bane, RN Outcome: Progressing

## 2021-10-01 NOTE — Discharge Summary (Signed)
Physician Discharge Summary  Adrienne Thompson ZOX:[id number] DOB: [birth year] DOA: 09/29/2021  PCP: Esperanza Richters, PA-C  Admit date: 09/29/2021 Discharge date: 10/01/2021  Admitted From: Home Disposition:  Home  Discharge Condition:Stable CODE STATUS:FULL Diet recommendation:Carb Modified   Brief/Interim Summary:  Patient is a 46 year old female with history of hypertension, diabetes type 2 who presents to [place] from the outside emergency department after further evaluation of abnormal labs.  Patient drives a schoolbus.  She was found to have uncontrolled diabetes with hemoglobin A1c of 13.6 recently and was started on metformin and glipizide.  She has been diagnosed with diabetes years ago but she was noncompliant and not taking medications.  After restarting the medication, she started having nausea and vomiting.  No history of abdomen pain or diarrhea.  Patient also reported taking antibiotics last week for vaginal issues.  On presentation, lab work showed leukocytosis, hyponatremia, hypokalemia, acute kidney injury with creatinine of 4.1(baseline creatinine is normal).  Patient admitted for the management of severe AKI, hyponatremia.  Kidney function, sodium level significantly improved with IV fluids.  Her A1c was found to be more than 13, diabetic coordinator consulted, started on insulin.  Patient medically stable for discharge today.  Following problems were addressed during her hospitalization:  AKI: Likely secondary to volume loss from vomiting.  Baseline kidney function normal.  Renal ultrasound was normal, no obstruction seen.  Started on IV fluids ,renal function almost normal   Hyponatremia: Again this could be hypovolemic hyponatremia from volume loss. Improved with IV fluids.   Hypokalemia:Supplemented   Hypertension: Currently normotensive.  Home medications will be stopped   Uncontrolled diabetes type 2: Recently started on metformin, glipizide.  Hemoglobin A1c of 13.6.  History  of noncompliance and stopped the medications by herself previously.  Diabetes coronary consulted.  Started on insulin  Microcytic anemia: Hemoglobin of 8-9 with microcytosis.  Patient gives history of menorrhagia.  Iron studies showed low iron of 13.  Give her dose of IV iron.  Will continue oral supplementation on discharge.   Obesity: BMI 33.4      Discharge Diagnoses:  Principal Problem:   AKI (acute kidney injury) (HCC) Active Problems:   Type 2 diabetes mellitus with hyperglycemia, without long-term current use of insulin (HCC)   Essential hypertension   Hyponatremia   Hypokalemia   Anemia    Discharge Instructions  Discharge Instructions     Amb Referral to Nutrition and Diabetic Education   Complete by: As directed    Diet Carb Modified   Complete by: As directed    Discharge instructions   Complete by: As directed    1) Please follow-up with your PCP in a week 2) please monitor your blood sugars at home.  Take the insulin as instructed. 3) Your blood pressure has been within normal range during this hospitalization without any medications.  We recommend to continue holding this medication at home unless her blood pressure increases.  Monitor blood pressure at home.  Follow-up with your PCP in a week and discuss about this   Increase activity slowly   Complete by: As directed       Allergies as of 10/01/2021   No Known Allergies      Medication List     STOP taking these medications    amLODipine 5 MG tablet Commonly known as: NORVASC   chlorthalidone 25 MG tablet Commonly known as: HYGROTON   glipiZIDE 5 MG tablet Commonly known as: GLUCOTROL   losartan 100 MG tablet Commonly known  as: COZAAR   metFORMIN 500 MG tablet Commonly known as: GLUCOPHAGE   metroNIDAZOLE 500 MG tablet Commonly known as: FLAGYL       TAKE these medications    ferrous sulfate 325 (65 FE) MG tablet Take 1 tablet (325 mg total) by mouth daily with breakfast. Start  taking on: October 02, 2021   NovoLIN 70/30 Kwikpen (70-30) 100 UNIT/ML KwikPen Generic drug: insulin isophane & regular human KwikPen Inject 8 Units into the skin 2 (two) times daily.   Pen Needles 3/16" 31G X 5 MM Misc 1 each by Does not apply route 2 (two) times daily.   TYLENOL PO Take 2 tablets by mouth daily as needed (cramps).        Follow-up Information     Saguier, Kateri Mc. Schedule an appointment as soon as possible for a visit in 1 week(s).   Specialties: Internal Medicine, Family Medicine Contact information: [address] [place] [address] [phone]                No Known Allergies  Consultations: None   Procedures/Studies: US RENAL  Result Date: 09/29/2021 CLINICAL DATA:  Acute renal insufficiency EXAM: RENAL / URINARY TRACT ULTRASOUND COMPLETE COMPARISON:  01/12/2021 FINDINGS: Right Kidney: Renal measurements: 12.6 x 5.3 x 6.6 cm = volume: 227.6 mL. Echogenicity within normal limits. No mass or hydronephrosis visualized. Left Kidney: Renal measurements: 11.8 x 5.6 x 6.2 cm = volume: 211.8 ML. Echogenicity within normal limits. No mass or hydronephrosis visualized. Bladder: Appears normal for degree of bladder distention. Other: Incidental note of multiple shadowing gallstones without evidence of cholecystitis. IMPRESSION: 1. Unremarkable renal ultrasound. 2. Incidental cholelithiasis without cholecystitis. Electronically Signed   By: Sharlet Salina M.D.   On: 09/29/2021 21:33      Subjective: Patient seen and examined at the bedside this morning.  Hemodynamically stable for discharge today.  Discharge Exam: Vitals:   10/01/21 0620 10/01/21 1002  BP: 114/76 119/83  Pulse: 63 95  Resp: 18 14  Temp: 98.1 F (36.7 C) 98.5 F (36.9 C)  SpO2: 100% 100%   Vitals:   09/30/21 1327 09/30/21 2020 10/01/21 0620 10/01/21 1002  BP: 122/74 119/69 114/76 119/83  Pulse: 88 78 63 95  Resp: 17 18 18 14   Temp: 98.7 F (37.1 C)  98.4 F (36.9 C) 98.1 F (36.7 C) 98.5 F (36.9 C)  TempSrc: Oral Oral Oral Oral  SpO2: 99% 99% 100% 100%  Weight:      Height:        General: Pt is alert, awake, not in acute distress Cardiovascular: RRR, S1/S2 +, no rubs, no gallops Respiratory: CTA bilaterally, no wheezing, no rhonchi Abdominal: Soft, NT, ND, bowel sounds + Extremities: no edema, no cyanosis    The results of significant diagnostics from this hospitalization (including imaging, microbiology, ancillary and laboratory) are listed below for reference.     Microbiology: No results found for this or any previous visit (from the past 240 hour(s)).   Labs: BNP (last 3 results) No results for input(s): "BNP" in the last 8760 hours. Basic Metabolic Panel: Recent Labs  Lab 09/28/21 1023 09/29/21 1220 09/30/21 0627 10/01/21 0536  NA 123* 126* 136 137  K 3.8 3.4* 3.8 3.7  CL 85* 90* 101 107  CO2 24 21* 21* 22  GLUCOSE 119* 208* 153* 154*  BUN 29* 41* 39* 34*  CREATININE 3.26* 4.15* 2.83* 1.37*  CALCIUM 9.6 8.9 9.1 8.4*   Liver Function  Tests: Recent Labs  Lab 09/28/21 1023  AST 14  ALT 9  ALKPHOS 103  BILITOT 0.5  PROT 8.3  ALBUMIN 4.3   No results for input(s): "LIPASE", "AMYLASE" in the last 168 hours. No results for input(s): "AMMONIA" in the last 168 hours. CBC: Recent Labs  Lab 09/29/21 1220 09/30/21 0627 10/01/21 0536  WBC 13.0* 10.5 8.0  HGB 9.5* 8.5* 8.0*  HCT 30.9* 28.4* 26.6*  MCV 67.2* 69.6* 70.6*  PLT 613* 566* 502*   Cardiac Enzymes: No results for input(s): "CKTOTAL", "CKMB", "CKMBINDEX", "TROPONINI" in the last 168 hours. BNP: Invalid input(s): "POCBNP" CBG: Recent Labs  Lab 09/30/21 0736 09/30/21 1132 09/30/21 1636 09/30/21 2021 10/01/21 0741  GLUCAP 158* 204* 189* 251* 150*   D-Dimer No results for input(s): "DDIMER" in the last 72 hours. Hgb A1c No results for input(s): "HGBA1C" in the last 72 hours. Lipid Profile No results for input(s): "CHOL", "HDL",  "LDLCALC", "TRIG", "CHOLHDL", "LDLDIRECT" in the last 72 hours. Thyroid function studies No results for input(s): "TSH", "T4TOTAL", "T3FREE", "THYROIDAB" in the last 72 hours.  Invalid input(s): "FREET3" Anemia work up Recent Labs    09/30/21 0627  TIBC 398  IRON 13*   Urinalysis    Component Value Date/Time   COLORURINE YELLOW 09/29/2021 1220   APPEARANCEUR HAZY (A) 09/29/2021 1220   LABSPEC 1.020 09/29/2021 1220   PHURINE 5.0 09/29/2021 1220   GLUCOSEU NEGATIVE 09/29/2021 1220   HGBUR TRACE (A) 09/29/2021 1220   BILIRUBINUR SMALL (A) 09/29/2021 1220   BILIRUBINUR moderate (A) 01/12/2021 0100   KETONESUR NEGATIVE 09/29/2021 1220   PROTEINUR NEGATIVE 09/29/2021 1220   UROBILINOGEN 0.2 01/12/2021 0100   NITRITE NEGATIVE 09/29/2021 1220   LEUKOCYTESUR NEGATIVE 09/29/2021 1220   Sepsis Labs Recent Labs  Lab 09/29/21 1220 09/30/21 0627 10/01/21 0536  WBC 13.0* 10.5 8.0   Microbiology No results found for this or any previous visit (from the past 240 hour(s)).  Please note: You were cared for by a hospitalist during your hospital stay. Once you are discharged, your primary care physician will handle any further medical issues. Please note that NO REFILLS for any discharge medications will be authorized once you are discharged, as it is imperative that you return to your primary care physician (or establish a relationship with a primary care physician if you do not have one) for your post hospital discharge needs so that they can reassess your need for medications and monitor your lab values.    Time coordinating discharge: 40 minutes  SIGNED:   Burnadette Pop, MD  [facility] 10/01/2021, 10:58 AM Pager [phone]  If 7PM-7AM, please contact night-coverage [email or web address] Password [email or web address]

## 2021-10-01 NOTE — Progress Notes (Signed)
Telemetry removed from patient per order expiration (was only for 24 hr continuous monitoring), patient had no acute cardiac events and had remained in sinus rhythm. CCMD notified and monitor cleaned and returned.

## 2021-10-01 NOTE — Progress Notes (Signed)
Pt discharged home. AVS printed and educational completed with teach back method. IV removed. VSS. Pt has all belongings. No further questions at this time.

## 2021-10-04 ENCOUNTER — Ambulatory Visit (INDEPENDENT_AMBULATORY_CARE_PROVIDER_SITE_OTHER): Payer: BC Managed Care – PPO | Admitting: Medical

## 2021-10-04 VITALS — BP 125/70 | HR 93 | Resp 18 | Ht 66.0 in | Wt 204.6 lb

## 2021-10-04 DIAGNOSIS — D649 Anemia, unspecified: Secondary | ICD-10-CM

## 2021-10-04 DIAGNOSIS — I10 Essential (primary) hypertension: Secondary | ICD-10-CM

## 2021-10-04 DIAGNOSIS — E1165 Type 2 diabetes mellitus with hyperglycemia: Secondary | ICD-10-CM | POA: Diagnosis not present

## 2021-10-04 NOTE — Patient Instructions (Addendum)
AKI: kidney function did improve before dc. Will get cmp to assess if further improved/hopefully back to baseline.   Hyponatremia: recheck today with cmp.   Hypokalemia:recheck with cmp.   Hypertension: Currently normotensive. keep checking bp daily. If bp mid 130's-140 or higher would start back on bp med. Likely low dose losartan.   Uncontrolled diabetes type 2: sugars are better. Continue insulin 8 units in am and pm. Refer to endocrinologist and keep appt with diabetic education.    Microcytic anemia: recheck cbc and iron level.   Follow up in 3 weeks or sooner if needed.  Send me my chart daily bp reading update in one week.

## 2021-10-04 NOTE — Progress Notes (Signed)
Subjective:    Patient ID: Imagene Gurney, female    DOB: [birth year], 46 y.o.   MRN: [id number]  HPI  Admit date: 09/29/2021 Discharge date: 10/01/2021   Admitted From: Home Disposition:  Home   Discharge Condition:Stable CODE STATUS:FULL Diet recommendation:Carb Modified    "Brief/Interim Summary:   Patient is a 46 year old female with history of hypertension, diabetes type 2 who presents to [place] from the outside emergency department after further evaluation of abnormal labs.  Patient drives a schoolbus.  She was found to have uncontrolled diabetes with hemoglobin A1c of 13.6 recently and was started on metformin and glipizide.  She has been diagnosed with diabetes years ago but she was noncompliant and not taking medications.  After restarting the medication, she started having nausea and vomiting.  No history of abdomen pain or diarrhea.  Patient also reported taking antibiotics last week for vaginal issues.  On presentation, lab work showed leukocytosis, hyponatremia, hypokalemia, acute kidney injury with creatinine of 4.1(baseline creatinine is normal).  Patient admitted for the management of severe AKI, hyponatremia.  Kidney function, sodium level significantly improved with IV fluids.  Her A1c was found to be more than 13, diabetic coordinator consulted, started on insulin.  Patient medically stable for discharge today.     Following problems were addressed during her hospitalization:   AKI: Likely secondary to volume loss from vomiting.  Baseline kidney function normal.  Renal ultrasound was normal, no obstruction seen.  Started on IV fluids ,renal function almost normal   Hyponatremia: Again this could be hypovolemic hyponatremia from volume loss. Improved with IV fluids.   Hypokalemia:Supplemented   Hypertension: Currently normotensive.  Home medications will be stopped   Uncontrolled diabetes type 2: Recently started on metformin, glipizide.  Hemoglobin A1c of 13.6.  History  of noncompliance and stopped the medications by herself previously.  Diabetes coronary consulted.  Started on insulin   Microcytic anemia: Hemoglobin of 8-9 with microcytosis.  Patient gives history of menorrhagia.  Iron studies showed low iron of 13.  Give her dose of IV iron.  Will continue oral supplementation on discharge.   Obesity: BMI 33.4   Discharge Diagnoses:  Principal Problem:   AKI (acute kidney injury) (HCC) Active Problems:   Type 2 diabetes mellitus with hyperglycemia, without long-term current use of insulin (HCC)   Essential hypertension   Hyponatremia   Hypokalemia   Anemia"   Since hospitalization feels well. No nausea, no vomiting, no fatigue, no dizziness and no hyperglycemic symptoms.  Pt is on 8 units in am and 8 units pm.  Pt last sugar 144. In morning when she checks sugar 149 fasting.   Pt is on novolin 7/30 8 units in am and 8 units in pm.   Diabetic education appointment 12-13-21.  Bp meds stopped in hospital. Was on amlodipine 5 mg mg daily , chlorthalidone 25 mg daily and losartan 100 mg daily.   Review of Systems  Constitutional:  Negative for chills, fatigue and fever.  HENT:  Negative for congestion, drooling and ear discharge.   Respiratory:  Negative for cough, chest tightness, shortness of breath and wheezing.   Cardiovascular:  Negative for chest pain and palpitations.  Gastrointestinal:  Negative for abdominal pain, constipation and nausea.  Genitourinary:  Negative for dyspareunia, dysuria, flank pain and frequency.  Musculoskeletal:  Negative for back pain and joint swelling.  Skin:  Negative for rash.    Past Medical History:  Diagnosis Date   Anemia 09/29/2021   Diabetes  mellitus without complication (HCC)    Dyslipidemia    Hypertension      Social History   Socioeconomic History   Marital status: Single    Spouse name: Not on file   Number of children: Not on file   Years of education: Not on file   Highest education  level: Not on file  Occupational History   Not on file  Tobacco Use   Smoking status: Never   Smokeless tobacco: Never  Substance and Sexual Activity   Alcohol use: Never   Drug use: Never   Sexual activity: Not on file  Other Topics Concern   Not on file  Social History Narrative   Not on file   Social Determinants of Health   Financial Resource Strain: Not on file  Food Insecurity: Not on file  Transportation Needs: Not on file  Physical Activity: Not on file  Stress: Not on file  Social Connections: Not on file  Intimate Partner Violence: Not on file    No past surgical history on file.  Family History  Problem Relation Age of Onset   Hypertension Mother    Diabetes Mother     No Known Allergies  Current Outpatient Medications on File Prior to Visit  Medication Sig Dispense Refill   Acetaminophen (TYLENOL PO) Take 2 tablets by mouth daily as needed (cramps).     ferrous sulfate 325 (65 FE) MG tablet Take 1 tablet (325 mg total) by mouth daily with breakfast. 30 tablet 1   insulin isophane & regular human KwikPen (NOVOLIN 70/30 KWIKPEN) (70-30) 100 UNIT/ML KwikPen Inject 8 Units into the skin 2 (two) times daily. 15 mL 0   Insulin Pen Needle (PEN NEEDLES 3/16") 31G X 5 MM MISC 1 each by Does not apply route 2 (two) times daily. 100 each 1   No current facility-administered medications on file prior to visit.    BP 125/70 Comment: espac  Pulse 93   Resp 18   Ht 5\' 6"  (1.676 m)   Wt 204 lb 9.6 oz (92.8 kg)   LMP 09/01/2021   SpO2 99%   BMI 33.02 kg/m        Objective:   Physical Exam  General Mental Status- Alert. General Appearance- Not in acute distress.   Skin General: Color- Normal Color. Moisture- Normal Moisture.  Neck Carotid Arteries- Normal color. Moisture- Normal Moisture. No carotid bruits. No JVD.  Chest and Lung Exam Auscultation: Breath Sounds:-Normal.  Cardiovascular Auscultation:Rythm- Regular. Murmurs & Other Heart  Sounds:Auscultation of the heart reveals- No Murmurs.  Abdomen Inspection:-Inspeection Normal. Palpation/Percussion:Note:No mass. Palpation and Percussion of the abdomen reveal- Non Tender, Non Distended + BS, no rebound or guarding.    Neurologic Cranial Nerve exam:- CN III-XII intact(No nystagmus), symmetric smile. Drift Test:- No drift. Romberg Exam:- Negative.  Heal to Toe Gait exam:-Normal. Finger to Nose:- Normal/Intact Strength:- 5/5 equal and symmetric strength both upper and lower extremities.       Assessment & Plan:   Patient Instructions  AKI: kidney function did improve before dc. Will get cmp to assess if further improved/hopefully back to baseline.   Hyponatremia: recheck today with cmp.   Hypokalemia:recheck with cmp.   Hypertension: Currently normotensive. keep checking bp daily. If bp mid 130's-140 or higher would start back on bp med. Likely low dose losartan.   Uncontrolled diabetes type 2: sugars are better. Continue insulin 8 units in am and pm. Refer to endocrinologist and keep appt with diabetic education.  Microcytic anemia: recheck cbc and iron level.   Follow up in 3 weeks or sooner if needed.   Esperanza Richters, PA-C

## 2021-10-05 ENCOUNTER — Encounter: Payer: Self-pay | Admitting: Medical

## 2021-10-05 ENCOUNTER — Telehealth: Payer: Self-pay

## 2021-10-05 LAB — CBC WITH DIFFERENTIAL/PLATELET
Basophils Absolute: 0.1 10*3/uL (ref 0.0–0.1)
Basophils Relative: 1.1 % (ref 0.0–3.0)
Eosinophils Absolute: 0.1 10*3/uL (ref 0.0–0.7)
Eosinophils Relative: 1.4 % (ref 0.0–5.0)
HCT: 29.5 % — ABNORMAL LOW (ref 36.0–46.0)
Hemoglobin: 9 g/dL — ABNORMAL LOW (ref 12.0–15.0)
Lymphocytes Relative: 24.9 % (ref 12.0–46.0)
Lymphs Abs: 2.5 10*3/uL (ref 0.7–4.0)
MCHC: 30.5 g/dL (ref 30.0–36.0)
MCV: 69.3 fl — ABNORMAL LOW (ref 78.0–100.0)
Monocytes Absolute: 0.4 10*3/uL (ref 0.1–1.0)
Monocytes Relative: 4 % (ref 3.0–12.0)
Neutro Abs: 6.9 10*3/uL (ref 1.4–7.7)
Neutrophils Relative %: 68.6 % (ref 43.0–77.0)
Platelets: 571 10*3/uL — ABNORMAL HIGH (ref 150.0–400.0)
RBC: 4.25 Mil/uL (ref 3.87–5.11)
RDW: 18.1 % — ABNORMAL HIGH (ref 11.5–15.5)
WBC: 10.1 10*3/uL (ref 4.0–10.5)

## 2021-10-05 LAB — COMPREHENSIVE METABOLIC PANEL
ALT: 9 U/L (ref 0–35)
AST: 16 U/L (ref 0–37)
Albumin: 4 g/dL (ref 3.5–5.2)
Alkaline Phosphatase: 76 U/L (ref 39–117)
BUN: 10 mg/dL (ref 6–23)
CO2: 28 mEq/L (ref 19–32)
Calcium: 9.2 mg/dL (ref 8.4–10.5)
Chloride: 99 mEq/L (ref 96–112)
Creatinine, Ser: 0.89 mg/dL (ref 0.40–1.20)
GFR: 76.8 mL/min (ref >60.00)
Glucose, Bld: 150 mg/dL — ABNORMAL HIGH (ref 70–99)
Potassium: 3.8 mEq/L (ref 3.5–5.1)
Sodium: 138 mEq/L (ref 135–145)
Total Bilirubin: 0.2 mg/dL (ref 0.2–1.2)
Total Protein: 7.7 g/dL (ref 6.0–8.3)

## 2021-10-05 LAB — IRON: Iron: 79 ug/dL (ref 42–145)

## 2021-10-05 NOTE — Telephone Encounter (Signed)
Pt called in needed a letter for FMLA to get started and it will have to email.

## 2021-10-06 ENCOUNTER — Encounter: Payer: Self-pay | Admitting: Medical

## 2021-10-06 NOTE — Telephone Encounter (Signed)
Form faxed to [email or web address]  and also made pt aware that letter was in [place]

## 2021-10-07 ENCOUNTER — Encounter: Payer: Self-pay | Admitting: Medical

## 2021-10-08 ENCOUNTER — Encounter: Payer: Self-pay | Admitting: Medical

## 2021-10-09 ENCOUNTER — Encounter: Payer: Self-pay | Admitting: Medical

## 2021-10-10 ENCOUNTER — Encounter: Payer: Self-pay | Admitting: Medical

## 2021-10-12 ENCOUNTER — Encounter: Payer: Self-pay | Admitting: Medical

## 2021-10-16 ENCOUNTER — Encounter: Payer: Self-pay | Admitting: Medical

## 2021-10-20 ENCOUNTER — Encounter: Payer: Self-pay | Admitting: Medical

## 2021-10-21 ENCOUNTER — Encounter: Payer: Self-pay | Admitting: Medical

## 2021-10-21 ENCOUNTER — Ambulatory Visit: Payer: BC Managed Care – PPO | Admitting: Medical

## 2021-10-21 VITALS — BP 130/79 | HR 95 | Resp 18 | Ht 66.0 in | Wt 202.0 lb

## 2021-10-21 DIAGNOSIS — E1165 Type 2 diabetes mellitus with hyperglycemia: Secondary | ICD-10-CM | POA: Diagnosis not present

## 2021-10-21 DIAGNOSIS — I10 Essential (primary) hypertension: Secondary | ICD-10-CM | POA: Diagnosis not present

## 2021-10-21 NOTE — Progress Notes (Signed)
Subjective:    Patient ID: Adrienne Thompson, female    DOB: 05-06-1975, 46 y.o.   MRN: [id number]  HPI  Pt in for follow up.  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 She can't work presently ptis on insulin 8 uints twice daily.  She has appt on November 01, 2021.   "Pt recently had a1c of 13.6. Pt is a bus driver and needs tighter controll. Presently on insulin. Wanted endocrinologist to evaluate and treat. Internal or external referral. The quicker the better in light of pt occupation. Would appreciate quick referral if possible."  Pt states recently sugars have been 100-130 most of time before meals.. In morning before eating will get 140.  Sometimes after eating will get 120   Pt did go to DOT office on September 23, 2021. She state told would have 45 days to get a1c 10.0.   Pt has eaten less sugar. Pt has referral for diabetic education but has not been called yet?   Bp better controlled on recheck.  Pt has missed work  from September 26, 2021. She has fmla form that needs to be filled out.   Review of Systems  Constitutional:  Negative for chills and fatigue.  Respiratory:  Negative for cough, chest tightness, shortness of breath and wheezing.   Gastrointestinal:  Negative for abdominal pain and blood in stool.  Genitourinary:  Negative for dyspareunia.  Musculoskeletal:  Negative for back pain, joint swelling and myalgias.  Skin:  Negative for rash.  Psychiatric/Behavioral:  Negative for behavioral problems, decreased concentration and dysphoric mood.     Past Medical History:  Diagnosis Date   Anemia 09/29/2021   Diabetes mellitus without complication (HCC)    Dyslipidemia    Hypertension      Social History   Socioeconomic History   Marital status: Single    Spouse name: Not on file   Number of children: Not on file   Years of education: Not on file   Highest education level: Not on file  Occupational History   Not on file  Tobacco Use   Smoking  status: Never   Smokeless tobacco: Never  Substance and Sexual Activity   Alcohol use: Never   Drug use: Never   Sexual activity: Not on file  Other Topics Concern   Not on file  Social History Narrative   Not on file   Social Determinants of Health   Financial Resource Strain: Not on file  Food Insecurity: Not on file  Transportation Needs: Not on file  Physical Activity: Not on file  Stress: Not on file  Social Connections: Not on file  Intimate Partner Violence: Not on file    No past surgical history on file.  Family History  Problem Relation Age of Onset   Hypertension Mother    Diabetes Mother     No Known Allergies  Current Outpatient Medications on File Prior to Visit  Medication Sig Dispense Refill   Acetaminophen (TYLENOL PO) Take 2 tablets by mouth daily as needed (cramps).     ferrous sulfate 325 (65 FE) MG tablet Take 1 tablet (325 mg total) by mouth daily with breakfast. 30 tablet 1   insulin isophane & regular human KwikPen (NOVOLIN 70/30 KWIKPEN) (70-30) 100 UNIT/ML KwikPen Inject 8 Units into the skin 2 (two) times daily. 15 mL 0   Insulin Pen Needle (PEN NEEDLES 3/16") 31G X 5 MM MISC 1 each by Does not apply route 2 (two) times  daily. 100 each 1   No current facility-administered medications on file prior to visit.    BP 130/79   Pulse 95   Resp 18   Ht 5\' 6"  (1.676 m)   Wt 202 lb (91.6 kg)   LMP 09/01/2021   SpO2 100%   BMI 32.60 kg/m        Objective:   Physical Exam  General Mental Status- Alert. General Appearance- Not in acute distress.   Skin General: Color- Normal Color. Moisture- Normal Moisture.  Neck Carotid Arteries- Normal color. Moisture- Normal Moisture. No carotid bruits. No JVD.  Chest and Lung Exam Auscultation: Breath Sounds:-Normal.  Cardiovascular Auscultation:Rythm- Regular. Murmurs & Other Heart Sounds:Auscultation of the heart reveals- No Murmurs.  Abdomen Inspection:-Inspeection  Normal. Palpation/Percussion:Note:No mass. Palpation and Percussion of the abdomen reveal- Non Tender, Non Distended + BS, no rebound or guarding.   Neurologic Cranial Nerve exam:- CN III-XII intact(No nystagmus), symmetric smile. Strength:- 5/5 equal and symmetric strength both upper and lower extremities.       Assessment & Plan:   Patient Instructions  For diabetes- continue current insulin 8 units twice daily. Number to diabetic education is [phone]. Attend endocrinolgist appt date.  You are not due for a1c but since DOT wants your a1c to by down to 10. I am investigating how much A1c would cost without insurance covering. Office RN/manager will try to get cost info.  Writing letter to ask DOT if can allow extension of time as you have not seen endocrinologist yet.  Htn- bp controlled today and not on medication. Check bp at home 3 times a week to make sure not increasing.   Follow up  3 weeks or sooner if needed.   Let me know exact date when a1c needs to be down to 10.0      Time spent with patient today was 45 minutes which consisted of chart review, discussing diagnosis, work up treatment and documentation. Additional time spent discussing how would fill out her fmla form once I know when a1c 10.0 is due as well as what cost might be since has not been 3 months since last a1c. Reviewed how would fill out form in light of her not seeing specialist yet. Discussed time frame I need to fill out form. Also that she may benefit from getting endocrinlogist to fill out.

## 2021-10-21 NOTE — Patient Instructions (Addendum)
For diabetes- continue current insulin 8 units twice daily. Number to diabetic education is [phone]. Attend endocrinolgist appt date.  You are not due for a1c but since DOT wants your a1c to by down to 10. I am investigating how much A1c would cost without insurance covering. Office RN/manager will try to get cost info.  Writing letter to ask DOT if can allow extension of time as you have not seen endocrinologist yet.  Htn- bp controlled today and not on medication. Check bp at home 3 times a week to make sure not increasing.   Follow up  3 weeks or sooner if needed.   Let me know exact date when a1c needs to be down to 10.0

## 2021-10-25 ENCOUNTER — Ambulatory Visit: Payer: BC Managed Care – PPO | Admitting: Medical

## 2021-10-25 ENCOUNTER — Encounter: Payer: Self-pay | Admitting: Medical

## 2021-10-25 DIAGNOSIS — E1165 Type 2 diabetes mellitus with hyperglycemia: Secondary | ICD-10-CM

## 2021-10-28 NOTE — Telephone Encounter (Signed)
Pt called and lvm to return call to call our office to schedule an appt

## 2021-10-28 NOTE — Telephone Encounter (Signed)
Appt made for 09/11, orders need to be added.

## 2021-10-31 ENCOUNTER — Other Ambulatory Visit (INDEPENDENT_AMBULATORY_CARE_PROVIDER_SITE_OTHER): Payer: BC Managed Care – PPO

## 2021-10-31 DIAGNOSIS — E1165 Type 2 diabetes mellitus with hyperglycemia: Secondary | ICD-10-CM | POA: Diagnosis not present

## 2021-10-31 LAB — HEMOGLOBIN A1C: Hgb A1c MFr Bld: 8.7 % — ABNORMAL HIGH (ref 4.6–6.5)

## 2021-11-01 ENCOUNTER — Encounter: Payer: Self-pay | Admitting: Medical

## 2021-11-02 ENCOUNTER — Ambulatory Visit (INDEPENDENT_AMBULATORY_CARE_PROVIDER_SITE_OTHER): Payer: BC Managed Care – PPO | Admitting: Medical

## 2021-11-02 ENCOUNTER — Encounter: Payer: Self-pay | Admitting: Medical

## 2021-11-02 VITALS — BP 124/86 | HR 90 | Temp 99.0°F | Ht 66.0 in | Wt 199.2 lb

## 2021-11-02 DIAGNOSIS — Z794 Long term (current) use of insulin: Secondary | ICD-10-CM

## 2021-11-02 DIAGNOSIS — E1165 Type 2 diabetes mellitus with hyperglycemia: Secondary | ICD-10-CM

## 2021-11-02 NOTE — Progress Notes (Signed)
Subjective:    Patient ID: Adrienne Thompson, female    DOB: 1975-06-26, 46 y.o.   MRN: [id number]  HPI Pt in for follow up.  Pt has DOT license. Her a1c was  13.6 on 09-28-2021 then on 10-31-2021 a1c was 8.7. Pt was told that a1c would qualify if a1c was 10.   Pt just started insulin. She is on novolin 70/30 8 units twice daily.   Pt checking her sugars 3 times a day recently. She is getting 130 sugar levels recently consistently.  She has appointment for sept 25 diabetic education.  She will see endocrinologist on 11-25-2021.  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  She had dot exam one week before and her sugars were found out to be too high.  On review of forms for which she needs waiver looks like she has to have daily blood sugar recorded for 3 months and then ultimate decision to be made by dot examiner.    Review of Systems  Constitutional:  Negative for chills, fatigue and fever.  Respiratory:  Negative for chest tightness, shortness of breath and wheezing.   Cardiovascular:  Negative for chest pain and palpitations.  Gastrointestinal:  Negative for abdominal pain, diarrhea and rectal pain.  Genitourinary:  Negative for difficulty urinating, dysuria, flank pain and frequency.  Musculoskeletal:  Negative for back pain.  Skin:  Negative for rash.    Past Medical History:  Diagnosis Date   Anemia 09/29/2021   Diabetes mellitus without complication (HCC)    Dyslipidemia    Hypertension      Social History   Socioeconomic History   Marital status: Single    Spouse name: Not on file   Number of children: Not on file   Years of education: Not on file   Highest education level: Not on file  Occupational History   Not on file  Tobacco Use   Smoking status: Never   Smokeless tobacco: Never  Substance and Sexual Activity   Alcohol use: Never   Drug use: Never   Sexual activity: Not on file  Other Topics Concern   Not on file  Social  History Narrative   Not on file   Social Determinants of Health   Financial Resource Strain: Not on file  Food Insecurity: Not on file  Transportation Needs: Not on file  Physical Activity: Not on file  Stress: Not on file  Social Connections: Not on file  Intimate Partner Violence: Not on file    No past surgical history on file.  Family History  Problem Relation Age of Onset   Hypertension Mother    Diabetes Mother     No Known Allergies  Current Outpatient Medications on File Prior to Visit  Medication Sig Dispense Refill   Acetaminophen (TYLENOL PO) Take 2 tablets by mouth daily as needed (cramps).     ferrous sulfate 325 (65 FE) MG tablet Take 1 tablet (325 mg total) by mouth daily with breakfast. 30 tablet 1   insulin isophane & regular human KwikPen (NOVOLIN 70/30 KWIKPEN) (70-30) 100 UNIT/ML KwikPen Inject 8 Units into the skin 2 (two) times daily. 15 mL 0   Insulin Pen Needle (PEN NEEDLES 3/16") 31G X 5 MM MISC 1 each by Does not apply route 2 (two) times daily. 100 each 1   No current facility-administered medications on file prior to visit.    BP 124/86   Pulse 90   Temp  99 F (37.2 C) (Oral)   Ht 5\' 6"  (1.676 m)   Wt 199 lb 3.2 oz (90.4 kg)   LMP 10/02/2021   SpO2 98%   BMI 32.15 kg/m        Objective:   Physical Exam  General- No acute distress. Pleasant patient. Neck- Full range of motion, no jvd Lungs- Clear, even and unlabored. Heart- regular rate and rhythm. Neurologic- CNII- XII grossly intact.       Assessment & Plan:   Patient Instructions  Diabetes with recent requiring use of insulin 8 units twice daily. A1c recently 8.7. Not at goal yet but much better than prior at 13.6. Referral to diabetic educator and endocrinologist upcoming.  Main issue today is that you presently don't meet DOT qualifications and require waiver.   On review of paperwork it would be best for the waiver be in done thru the endocrinologist and DOT examiner. I  went ahead and filled out fmla paperwork today and gave work note expalaining your current situation.  I also provided you with blank fmla paperwork for endocrinologist to eventually fill out paperwork as well.  Follow up with me approximate 12-26-2021 or sooner  if needed.    Time spent with patient today was 43  minutes which consisted of chart review, discussing diagnosis, treatment plan, filling out fmla paperwork, explaining process on how she will hopefully get waiver  and documentation.

## 2021-11-02 NOTE — Patient Instructions (Signed)
Diabetes with recent requiring use of insulin 8 units twice daily. A1c recently 8.7. Not at goal yet but much better than prior at 13.6. Referral to diabetic educator and endocrinologist upcoming.  Main issue today is that you presently don't meet DOT qualifications and require waiver.   On review of paperwork it would be best for the waiver be in done thru the endocrinologist and DOT examiner. I went ahead and filled out fmla paperwork today and gave work note expalaining your current situation.  I also provided you with blank fmla paperwork for endocrinologist to eventually fill out paperwork as well.  Follow up with me approximate 12-26-2021 or sooner  if needed.

## 2021-11-14 ENCOUNTER — Encounter: Payer: Self-pay | Admitting: Dietician

## 2021-11-14 ENCOUNTER — Encounter: Payer: BC Managed Care – PPO | Attending: Medical | Admitting: Dietician

## 2021-11-14 DIAGNOSIS — E1165 Type 2 diabetes mellitus with hyperglycemia: Secondary | ICD-10-CM | POA: Diagnosis present

## 2021-11-14 NOTE — Progress Notes (Signed)
Diabetes Self-Management Education  Visit Type: First/Initial  Appt. Start Time: 1531 Appt. End Time: 1642  11/14/2021  [name], identified by name and date of birth, is a 46 y.o. female with a diagnosis of Diabetes: Type 2.   ASSESSMENT  Primary concern: Pt is concerned about the nutrition implications with diabetes and states she wants to learn about how to eat well.   History includes:  anemia, type 2 diabetes, HTN. Labs noted:  A1C 8.7% 10/31/21 Medications include: novolin 70/30 8 units 2x/day Supplements: iron  Patient lives alone and does her own shopping and cooking. She states she has made plenty of changes since her diabetes diagnosis and her previous hospital visit. Pt reports that she used to just snack all day and now has more structured meal times.  Pt goes walking every morning at a park for 30 minutes.   Height 5\' 6"  (1.676 m), weight 199 lb 14.4 oz (90.7 kg), last menstrual period 10/02/2021. Body mass index is 32.26 kg/m.   Diabetes Self-Management Education - 11/14/21 1534       Visit Information   Visit Type First/Initial      Initial Visit   Diabetes Type Type 2    Date Diagnosed 2019    Are you currently following a meal plan? No    Are you taking your medications as prescribed? Yes      Health Coping   How would you rate your overall health? Fair      Psychosocial Assessment   Patient Belief/Attitude about Diabetes Motivated to manage diabetes    What is the hardest part about your diabetes right now, causing you the most concern, or is the most worrisome to you about your diabetes?   Making healty food and beverage choices    Self-care barriers None    Self-management support Doctor's office    Other persons present Patient    Patient Concerns Nutrition/Meal planning    Special Needs None    Preferred Learning Style No preference indicated    Learning Readiness Ready    How often do you need to have someone help you when you read  instructions, pamphlets, or other written materials from your doctor or pharmacy? 1 - Never    What is the last grade level you completed in school? 12th      Pre-Education Assessment   Patient understands the diabetes disease and treatment process. Needs Instruction    Patient understands incorporating nutritional management into lifestyle. Needs Instruction    Patient undertands incorporating physical activity into lifestyle. Needs Instruction    Patient understands using medications safely. Needs Instruction    Patient understands monitoring blood glucose, interpreting and using results Needs Instruction    Patient understands prevention, detection, and treatment of acute complications. Needs Instruction    Patient understands prevention, detection, and treatment of chronic complications. Needs Instruction    Patient understands how to develop strategies to address psychosocial issues. Needs Instruction    Patient understands how to develop strategies to promote health/change behavior. Needs Instruction      Complications   Last HgB A1C per patient/outside source 8.7 %    How often do you check your blood sugar? 3-4 times/day    Fasting Blood glucose range (mg/dL) 2020    Number of hypoglycemic episodes per month 0    Have you had a dilated eye exam in the past 12 months? No    Have you had a dental exam in the past  12 months? No    Are you checking your feet? Yes    How many days per week are you checking your feet? 7      Dietary Intake   Breakfast oatmeal with a banana with egg on toast and fruit    Snack (morning) pecans or popcorn    Lunch skips OR snack    Dinner tilapia with sweet potato and vegetables and fruit cup    Snack (evening) peanut butter sandwich on whole wheat    Beverage(s) crystal light, water 64oz, diet coke      Activity / Exercise   Activity / Exercise Type Light (walking / raking leaves)    How many days per week do you exercise? 7    How many  minutes per day do you exercise? 30    Total minutes per week of exercise 210      Patient Education   Previous Diabetes Education No    Disease Pathophysiology Definition of diabetes, type 1 and 2, and the diagnosis of diabetes;Factors that contribute to the development of diabetes;Explored patient's options for treatment of their diabetes    Healthy Eating Role of diet in the treatment of diabetes and the relationship between the three main macronutrients and blood glucose level;Food label reading, portion sizes and measuring food.;Plate Method;Carbohydrate counting;Reviewed blood glucose goals for pre and post meals and how to evaluate the patients' food intake on their blood glucose level.;Meal timing in regards to the patients' current diabetes medication.;Information on hints to eating out and maintain blood glucose control.;Meal options for control of blood glucose level and chronic complications.    Being Active Role of exercise on diabetes management, blood pressure control and cardiac health.    Medications Taught/reviewed insulin/injectables, injection, site rotation, insulin/injectables storage and needle disposal.;Reviewed patients medication for diabetes, action, purpose, timing of dose and side effects.    Monitoring Identified appropriate SMBG and/or A1C goals.;Daily foot exams;Yearly dilated eye exam    Acute complications Discussed and identified patients' prevention, symptoms, and treatment of hyperglycemia.;Taught prevention, symptoms, and  treatment of hypoglycemia - the 15 rule.    Chronic complications Relationship between chronic complications and blood glucose control;Assessed and discussed foot care and prevention of foot problems;Lipid levels, blood glucose control and heart disease;Identified and discussed with patient  current chronic complications;Dental care;Nephropathy, what it is, prevention of, the use of ACE, ARB's and early detection of through urine  microalbumia.;Retinopathy and reason for yearly dilated eye exams    Diabetes Stress and Support Identified and addressed patients feelings and concerns about diabetes;Role of stress on diabetes;Worked with patient to identify barriers to care and solutions    Lifestyle and Health Coping Lifestyle issues that need to be addressed for better diabetes care      Individualized Goals (developed by patient)   Nutrition General guidelines for healthy choices and portions discussed;Follow meal plan discussed    Physical Activity Exercise 5-7 days per week;30 minutes per day    Medications take my medication as prescribed    Monitoring  Test my blood glucose as discussed    Problem Solving Eating Pattern    Reducing Risk examine blood glucose patterns;do foot checks daily;treat hypoglycemia with 15 grams of carbs if blood glucose less than 70mg /dL    Health Coping Ask for help with psychological, social, or emotional issues      Post-Education Assessment   Patient understands the diabetes disease and treatment process. Comprehends key points    Patient understands incorporating nutritional  management into lifestyle. Comprehends key points    Patient undertands incorporating physical activity into lifestyle. Demonstrates understanding / competency    Patient understands using medications safely. Demonstrates understanding / competency    Patient understands monitoring blood glucose, interpreting and using results Comprehends key points    Patient understands prevention, detection, and treatment of acute complications. Comprehends key points    Patient understands prevention, detection, and treatment of chronic complications. Comprehends key points    Patient understands how to develop strategies to address psychosocial issues. Comprehends key points    Patient understands how to develop strategies to promote health/change behavior. Comprehends key points      Outcomes   Expected Outcomes Demonstrated  interest in learning. Expect positive outcomes    Future DMSE 3-4 months    Program Status Not Completed             Individualized Plan for Diabetes Self-Management Training:   Learning Objective:  Patient will have a greater understanding of diabetes self-management. Patient education plan is to attend individual and/or group sessions per assessed needs and concerns.   Plan:   Patient Instructions  When snacking, pair a protein and carbohydrate. Consider trying the light greek yogurt from [employer].  Continue aiming to get 150 minutes or more of physical activity weekly.  Continue medication as directed by doctor.   Plan:  Aim for 2-3 Carb Choices per meal (30-45 grams) +/- 1 either way  Include protein in moderation with your meals and snacks Consider reading food labels for Total Carbohydrate of foods Consider checking BG at alternate times per day   Consider switching to 2% milk.  Consider trying [employer] dressings (in refrigerated produce aisle)  Expected Outcomes:  Demonstrated interest in learning. Expect positive outcomes  Education material provided: ADA - How to Thrive: A Guide for Your Journey with Diabetes, Meal plan card, My Plate, and Snack sheet  If problems or questions, patient to contact team via:  Phone  Future DSME appointment: 3-4 months

## 2021-11-14 NOTE — Patient Instructions (Addendum)
When snacking, pair a protein and carbohydrate. Consider trying the light greek yogurt from [place].  Continue aiming to get 150 minutes or more of physical activity weekly.  Continue medication as directed by doctor.   Plan:  Aim for 2-3 Carb Choices per meal (30-45 grams) +/- 1 either way  Include protein in moderation with your meals and snacks Consider reading food labels for Total Carbohydrate of foods Consider checking BG at alternate times per day   Consider switching to 2% milk.  Consider trying [employer] dressings (in refrigerated produce aisle)

## 2021-11-20 ENCOUNTER — Telehealth: Payer: Self-pay | Admitting: Medical

## 2021-11-20 NOTE — Telephone Encounter (Signed)
Will you call Adrienne Thompson and make sure she takes insulin treated diabetic mellitus assessment form to her endocrinologist appointment this Friday. She should have copy of that form. If not then stop by our office to pick up copy. I believe she is seeing [facility]. So they should have access to labs and prior notes etc.  Ask pt to update me this coming Monday or Tuesday as to how that office visit went.

## 2021-12-02 ENCOUNTER — Ambulatory Visit: Payer: BC Managed Care – PPO | Admitting: Medical

## 2021-12-02 ENCOUNTER — Encounter: Payer: Self-pay | Admitting: Medical

## 2021-12-02 VITALS — BP 140/80 | HR 82 | Resp 18 | Ht 66.0 in | Wt 202.4 lb

## 2021-12-02 DIAGNOSIS — E1165 Type 2 diabetes mellitus with hyperglycemia: Secondary | ICD-10-CM | POA: Diagnosis not present

## 2021-12-02 DIAGNOSIS — I10 Essential (primary) hypertension: Secondary | ICD-10-CM | POA: Diagnosis not present

## 2021-12-02 MED ORDER — LOSARTAN POTASSIUM 50 MG PO TABS: 50.0000 mg | ORAL_TABLET | Freq: Every day | ORAL | 11 refills | Status: DC

## 2021-12-02 NOTE — Progress Notes (Signed)
Subjective:    Patient ID: Adrienne Thompson, female    DOB: 09-24-1975, 46 y.o.   MRN: [id number]  HPI Pt in for follow up.  She has seen endocrinologist.  "Plan   Type 2 Diabetes Mellitus, HbA1c 8.7% (10/2021): A1c goal for this patient is <7% without severe or recurrent hypoglycemia. Though she was diagnosed with T2DM, recommend evaluation for T1DM with c-peptide and GAD abs given sudden worsening of HbA1c followed by significant improvement after 1 month of insulin administration. Will continue with insulin therapy if she has T1DM and discuss either continuation of mixed insulin vs. Changing to basal bolus regimen at next appointment.  If she has type 2 diabetes will discuss trial of extended release metformin.  I personally placed a Dexcom G7 CGM on the patient today and provided her with a reader which I programmed. Will have her follow up with diabetes education for ongoing CGM training.  - Continue novolin 70/30 8 units BID for now. - BMP, c-peptide, and GAD abs.  - Rx for Dexcom G7 CGM sent to pharmacy. Reviewed the need to confirm hypoglycemia and significant hyperglycemic with fingerstick checks.  - I have discussed with the patient the blood sugars continue to be monitored via Dexcom G7 CGM.    Blood Pressure: Patient is currently on losartan 100 mg daily. Patient's blood pressure is at goal.  Proteinuria: Update level today.   Lipids: On lipitor 20 mg daily. LDL up to date - not yet at goal for primary prevention."   Presently her sugar by cgm 99.   Pt is seeing dietician next month. I had given Insulin- treated diabete mellitus form for pt endocrinologist to fill out.    Htn- in past bp treated in 2019. 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     Review of Systems  Constitutional:  Negative for chills, fatigue and fever.  Respiratory:  Negative for cough, chest tightness and wheezing.   Cardiovascular:  Negative  for chest pain and palpitations.  Gastrointestinal:  Negative for abdominal pain.  Endocrine: Negative for polydipsia, polyphagia and polyuria.  Genitourinary:  Negative for frequency.  Musculoskeletal:  Negative for back pain.  Skin:  Negative for rash.  Neurological:  Negative for dizziness, weakness, light-headedness and numbness.  Hematological:  Negative for adenopathy. Does not bruise/bleed easily.  Psychiatric/Behavioral:  Negative for behavioral problems and decreased concentration.     Past Medical History:  Diagnosis Date   Anemia 09/29/2021   Diabetes mellitus without complication ([place])    Dyslipidemia    Hypertension      Social History   Socioeconomic History   Marital status: Single    Spouse name: Not on file   Number of children: Not on file   Years of education: Not on file   Highest education level: Not on file  Occupational History   Not on file  Tobacco Use   Smoking status: Never   Smokeless tobacco: Never  Substance and Sexual Activity   Alcohol use: Never   Drug use: Never   Sexual activity: Not on file  Other Topics Concern   Not on file  Social History Narrative   Not on file   Social Determinants of Health   Financial Resource Strain: Not on file  Food Insecurity: Not on file  Transportation Needs: Not on file  Physical Activity: Not on file  Stress: Not on file  Social Connections: Not on file  Intimate Partner Violence: Not on file    No past surgical history on file.  Family History  Problem Relation Age of Onset   Hypertension Mother    Diabetes Mother     No Known Allergies  Current Outpatient Medications on File Prior to Visit  Medication Sig Dispense Refill   Acetaminophen (TYLENOL PO) Take 2 tablets by mouth daily as needed (cramps).     ferrous sulfate 325 (65 FE) MG tablet Take 1 tablet (325 mg total) by mouth daily with breakfast. 30 tablet 1   insulin isophane & regular human KwikPen (NOVOLIN 70/30 KWIKPEN) (70-30)  100 UNIT/ML KwikPen Inject 8 Units into the skin 2 (two) times daily. 15 mL 0   Insulin Pen Needle (PEN NEEDLES 3/16") 31G X 5 MM MISC 1 each by Does not apply route 2 (two) times daily. 100 each 1   No current facility-administered medications on file prior to visit.    BP (!) 140/80   Pulse 82   Resp 18   Ht 5\' 6"  (1.676 m)   Wt 202 lb 6.4 oz (91.8 kg)   LMP 10/02/2021   SpO2 100%   BMI 32.67 kg/m        Objective:   Physical Exam  General- No acute distress. Pleasant patient. Lungs- Clear, even and unlabored. Heart- regular rate and rhythm. Neurologic- CNII- XII grossly intact.       Assessment & Plan:   Patient Instructions  Diabetes-A1c recently came down from 13.6 in August 2 most recently 8.7 in September.  This was after using insulin and watching diet closely.  Patient is on Novolin 70/30 units twice daily now.  She has followed up with endocrinologist and has continuous glucose monitor as well as glucagon nasal spray.  I filled out quite extensive FMLA form today while in office as she has deadline paperwork that she recently received from [employer].  I had hoped that endocrinologist office would fill out the paperwork but they have not done so.  Fill out form and send in its entirety today during visit.  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  Only provided information needed to make that decision.  This was explicitly stated on the form as well.   Later in the day received second form that I filled out.  Hypertension-patient had high blood pressure in the past and better controlled recently but then spiked back up to borderline level.  Restarted her on losartan at 50 mg dose today.  Follow-up in 2 weeks for nurse blood pressure check.  Will fill out further forms if needed such as FMLA as there is for the delay her  work might give additional forms.  Patient does have upcoming appointment with diabetic education.  Also asked patient to take the form I filled out today and have specialist office fill out as well.  I had intended to defer to them but they have not filled out the form as of yet.  Patient expressed that form needs to be filled out today so I went ahead and fill the form out.   04-10-1973, PA-C   Time spent with patient today was  43 minutes which consisted of chart review, discussing diagnosis, work up, treatment, filling our 2 Dot forms and documentation.

## 2021-12-02 NOTE — Patient Instructions (Addendum)
Diabetes-A1c recently came down from 13.6 in August 2 most recently 8.7 in September.  This was after using insulin and watching diet closely.  Patient is on Novolin 70/30 units twice daily now.  She has followed up with endocrinologist and has continuous glucose monitor as well as glucagon nasal spray.  I filled out quite extensive FMLA form today while in office as she has deadline paperwork that she recently received from [employer].  I had hoped that endocrinologist office would fill out the paperwork but they have not done so.  Fill out form and send in its entirety today during visit.  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  Only provided information needed to make that decision.  This was explicitly stated on the form as well.   Later in the day received second form that I filled out.  Hypertension-patient had high blood pressure in the past and better controlled recently but then spiked back up to borderline level.  Restarted her on losartan at 50 mg dose today.  Follow-up in 2 weeks for nurse blood pressure check.  Will fill out further forms if needed such as FMLA as there is for the delay her work might give additional forms.  Patient does have upcoming appointment with diabetic education.  Also asked patient to take the form I filled out today and have specialist office fill out as well.  I had intended to defer to them but they have not filled out the form as of yet.  Patient expressed that form needs to be filled out today so I went ahead and fill the form out.

## 2021-12-05 ENCOUNTER — Encounter: Payer: Self-pay | Admitting: Medical

## 2021-12-05 NOTE — Telephone Encounter (Signed)
Pt is wondering if she can get her fmla extended till at least 10/23. Fax to [phone].

## 2021-12-06 NOTE — Telephone Encounter (Signed)
Pt called stating [employer] has not received the fax that was sent yesterday and would like it resent to them when possible.

## 2021-12-06 NOTE — Telephone Encounter (Signed)
Forms refaxed to [employer]

## 2021-12-06 NOTE — Telephone Encounter (Signed)
Pt called back and stated all they need is the most recent ov notes with pcp faxed to [employer]/the same number. She did not have the number to confirm.

## 2021-12-13 ENCOUNTER — Ambulatory Visit: Payer: BC Managed Care – PPO | Admitting: Skilled Nursing Facility1

## 2021-12-13 NOTE — Telephone Encounter (Signed)
Pt stated they have not received fmla extension and need it faxed to [phone].

## 2021-12-14 NOTE — Telephone Encounter (Signed)
Papers faxed.

## 2021-12-19 ENCOUNTER — Ambulatory Visit: Payer: BC Managed Care – PPO | Admitting: Medical

## 2021-12-19 ENCOUNTER — Encounter: Payer: Self-pay | Admitting: Medical

## 2021-12-19 VITALS — BP 136/80 | HR 89 | Resp 18 | Ht 66.0 in | Wt 197.8 lb

## 2021-12-19 DIAGNOSIS — Z1211 Encounter for screening for malignant neoplasm of colon: Secondary | ICD-10-CM | POA: Diagnosis not present

## 2021-12-19 DIAGNOSIS — E785 Hyperlipidemia, unspecified: Secondary | ICD-10-CM

## 2021-12-19 LAB — COMPREHENSIVE METABOLIC PANEL
ALT: 12 U/L (ref 0–35)
AST: 11 U/L (ref 0–37)
Albumin: 4.2 g/dL (ref 3.5–5.2)
Alkaline Phosphatase: 81 U/L (ref 39–117)
BUN: 15 mg/dL (ref 6–23)
CO2: 26 mEq/L (ref 19–32)
Calcium: 9.6 mg/dL (ref 8.4–10.5)
Chloride: 100 mEq/L (ref 96–112)
Creatinine, Ser: 0.74 mg/dL (ref 0.40–1.20)
GFR: 95.7 mL/min (ref >60.00)
Glucose, Bld: 113 mg/dL — ABNORMAL HIGH (ref 70–99)
Potassium: 4.2 mEq/L (ref 3.5–5.1)
Sodium: 136 mEq/L (ref 135–145)
Total Bilirubin: 0.3 mg/dL (ref 0.2–1.2)
Total Protein: 7.7 g/dL (ref 6.0–8.3)

## 2021-12-19 LAB — LIPID PANEL
Cholesterol: 126 mg/dL (ref 0–200)
HDL: 36.2 mg/dL — ABNORMAL LOW (ref >39.00)
LDL Cholesterol: 76 mg/dL (ref 0–99)
NonHDL: 89.78
Total CHOL/HDL Ratio: 3
Triglycerides: 70 mg/dL (ref 0.0–149.0)
VLDL: 14 mg/dL (ref 0.0–40.0)

## 2021-12-19 MED ORDER — LOSARTAN POTASSIUM 100 MG PO TABS: 100.0000 mg | ORAL_TABLET | Freq: Every day | ORAL | 3 refills | Status: AC

## 2021-12-19 NOTE — Addendum Note (Signed)
Addended by: Anabel Halon on: 12/19/2021 11:24 AM   Modules accepted: Orders

## 2021-12-19 NOTE — Patient Instructions (Addendum)
Htn- bp today was 136/60. Your readings have been 130/80. On discussion you report formerly on 100 mg losartan daily. 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   For diabetes continue under management of endocrinologist. 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 But also asking you to schedule for wellness exam  Foe high cholstserol will get lipid panel and cmp today.  Follow up date to be determined after lab review.

## 2021-12-19 NOTE — Progress Notes (Signed)
Subjective:    Patient ID: Adrienne Thompson, female    DOB: 12/15/1975, 46 y.o.   MRN: [id number]  HPI  Here for follow up on htn.   Last visit hp on htn in " below.  "Hypertension- patient had high blood pressure in the past and better controlled recently but then spiked back up to borderline level.  Restarted her on losartan at 50 mg dose today."  Pt states has been checking her bp at home and her bp readings have been 130/80.   Pt is seeing endocrinologist. She has been seeing due diabetes and needing clearance for DOT.   Review of Systems  Constitutional:  Negative for chills, fatigue and fever.  Respiratory:  Negative for chest tightness, shortness of breath and wheezing.   Cardiovascular:  Negative for chest pain and palpitations.  Gastrointestinal:  Negative for abdominal pain.  Musculoskeletal:  Negative for back pain.  Skin:  Negative for rash.  Neurological:  Negative for dizziness, syncope, weakness, numbness and headaches.  Hematological:  Negative for adenopathy. Does not bruise/bleed easily.  Psychiatric/Behavioral:  Negative for behavioral problems and confusion.     Past Medical History:  Diagnosis Date   Anemia 09/29/2021   Diabetes mellitus without complication ([place])    Dyslipidemia    Hypertension      Social History   Socioeconomic History   Marital status: Single    Spouse name: Not on file   Number of children: Not on file   Years of education: Not on file   Highest education level: Not on file  Occupational History   Not on file  Tobacco Use   Smoking status: Never   Smokeless tobacco: Never  Substance and Sexual Activity   Alcohol use: Never   Drug use: Never   Sexual activity: Not on file  Other Topics Concern   Not on file  Social History Narrative   Not on file   Social Determinants of Health   Financial Resource Strain: Not on file  Food Insecurity: Not on file  Transportation Needs: Not on file  Physical Activity: Not on file   Stress: Not on file  Social Connections: Not on file  Intimate Partner Violence: Not on file    No past surgical history on file.  Family History  Problem Relation Age of Onset   Hypertension Mother    Diabetes Mother     No Known Allergies  Current Outpatient Medications on File Prior to Visit  Medication Sig Dispense Refill   Acetaminophen (TYLENOL PO) Take 2 tablets by mouth daily as needed (cramps).     ferrous sulfate 325 (65 FE) MG tablet Take 1 tablet (325 mg total) by mouth daily with breakfast. 30 tablet 1   insulin isophane & regular human KwikPen (NOVOLIN 70/30 KWIKPEN) (70-30) 100 UNIT/ML KwikPen Inject 8 Units into the skin 2 (two) times daily. 15 mL 0   Insulin Pen Needle (PEN NEEDLES 3/16") 31G X 5 MM MISC 1 each by Does not apply route 2 (two) times daily. 100 each 1   losartan (COZAAR) 50 MG tablet Take 1 tablet (50 mg total) by mouth daily. 30 tablet 11   No current facility-administered medications on file prior to visit.    BP 136/80   Pulse 89   Resp 18   Ht 5\' 6"  (1.676 m)   Wt 197 lb 12.8 oz (89.7 kg)   SpO2 99%   BMI 31.93 kg/m        Objective:  Physical Exam  General Mental Status- Alert. General Appearance- Not in acute distress.   Skin General: Color- Normal Color. Moisture- Normal Moisture.  Neck Carotid Arteries- Normal color. Moisture- Normal Moisture. No carotid bruits. No JVD.  Chest and Lung Exam Auscultation: Breath Sounds:-Normal.  Cardiovascular Auscultation:Rythm- Regular. Murmurs & Other Heart Sounds:Auscultation of the heart reveals- No Murmurs.    Neurologic Cranial Nerve exam:- CN III-XII intact(No nystagmus), symmetric smile. Strength:- 5/5 equal and symmetric strength both upper and lower extremities.       Assessment & Plan:   Patient Instructions  Htn- bp today was 136/60. Your readings have been 130/80. On discussion you report formerly on 100 mg losartan daily. 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   For diabetes continue under management of endocrinologist. I have filled out forms to help DOT understand diabetic history but clearance to drive would be determined by DOT examiner in conjunction with [place] referral for screening colonoscopy as saw you never had one done. But also asking you to schedule for wellness exam  Foe high choletserol will get lipid panel and cmp today.  Follow up date to be determined after lab review.

## 2021-12-20 ENCOUNTER — Telehealth: Payer: Self-pay | Admitting: Medical

## 2021-12-20 MED ORDER — NOVOLIN 70/30 FLEXPEN RELION (70-30) 100 UNIT/ML ~~LOC~~ SUPN: 8.0000 [IU] | PEN_INJECTOR | Freq: Two times a day (BID) | SUBCUTANEOUS | 0 refills | Status: AC

## 2021-12-20 MED ORDER — ATORVASTATIN CALCIUM 10 MG PO TABS: ORAL_TABLET | ORAL | 11 refills | Status: AC

## 2021-12-20 MED ORDER — "PEN NEEDLES 3/16"" 31G X 5 MM MISC": 1.0000 | Freq: Two times a day (BID) | 1 refills | Status: DC

## 2021-12-20 NOTE — Telephone Encounter (Signed)
Rx sent 

## 2021-12-20 NOTE — Telephone Encounter (Signed)
Medication: insulin isophane & regular human KwikPen (NOVOLIN 70/30 KWIKPEN) (70-30) 100 UNIT/ML KwikPen   Has the patient contacted their pharmacy? Yes.    Preferred Pharmacy (with phone number or street name): [facility] [place], [address] [place], [place] [address] Phone: [phone]  Fax: [phone]

## 2021-12-20 NOTE — Addendum Note (Signed)
Addended by: Anabel Halon on: 12/20/2021 07:19 PM   Modules accepted: Orders

## 2021-12-27 ENCOUNTER — Telehealth: Payer: Self-pay | Admitting: Medical

## 2021-12-27 NOTE — Telephone Encounter (Signed)
Pt called made her aware she needs to reach out to endo , pt voiced understanding

## 2021-12-27 NOTE — Telephone Encounter (Signed)
Pt called stating that she needed the following regarding her FMLA:  Updated extension note starting 10.13.23 - return date Dr. Note specifying return to work with no restrictions with return date as well  Pt would like to have these faxed over to the following number when complete:  ATTN: [employer] P: [phone]  Please Advise.

## 2022-01-16 ENCOUNTER — Telehealth: Payer: Self-pay | Admitting: Medical

## 2022-01-16 MED ORDER — "PEN NEEDLES 3/16"" 31G X 5 MM MISC": 1.0000 | Freq: Two times a day (BID) | 1 refills | Status: AC

## 2022-01-16 NOTE — Telephone Encounter (Signed)
Pt called stating that her original pharmacy was unable to fill the following Rx due to being out of stock. Pt attempted to go to [facility] location on the corner of [address] in [place], [place]. They told her that there was a delay on her refill and to contact the prescriber. Pt is unsure if pharmacy had them in stock to be able to refill and may need Rx routed to the following pharmacy:  Medication:   Insulin Pen Needle (PEN NEEDLES 3/16") 31G X 5 MM MISC [[id number]]   Has the patient contacted their pharmacy? No. (If no, request that the patient contact the pharmacy for the refill.) (If yes, when and what did the pharmacy advise?)  Preferred Pharmacy (with phone number or street name):   [facility] [address] [place], [place] [address] P: [phone]  Agent: Please be advised that RX refills may take up to 3 business days. We ask that you follow-up with your pharmacy.

## 2022-01-16 NOTE — Telephone Encounter (Signed)
Rx sent to [facility]

## 2022-02-15 ENCOUNTER — Encounter: Payer: BC Managed Care – PPO | Admitting: Dietician

## 2022-05-31 IMAGING — MG DIGITAL SCREENING BILAT W/ TOMO W/ CAD
6 of 10 series · 6 of 30 positions shown · non-contrast
Comparison: Previous exam(s).

CLINICAL DATA: Screening.

EXAM:
DIGITAL SCREENING BILATERAL MAMMOGRAM WITH TOMO AND CAD

[R MLO synth-2D]
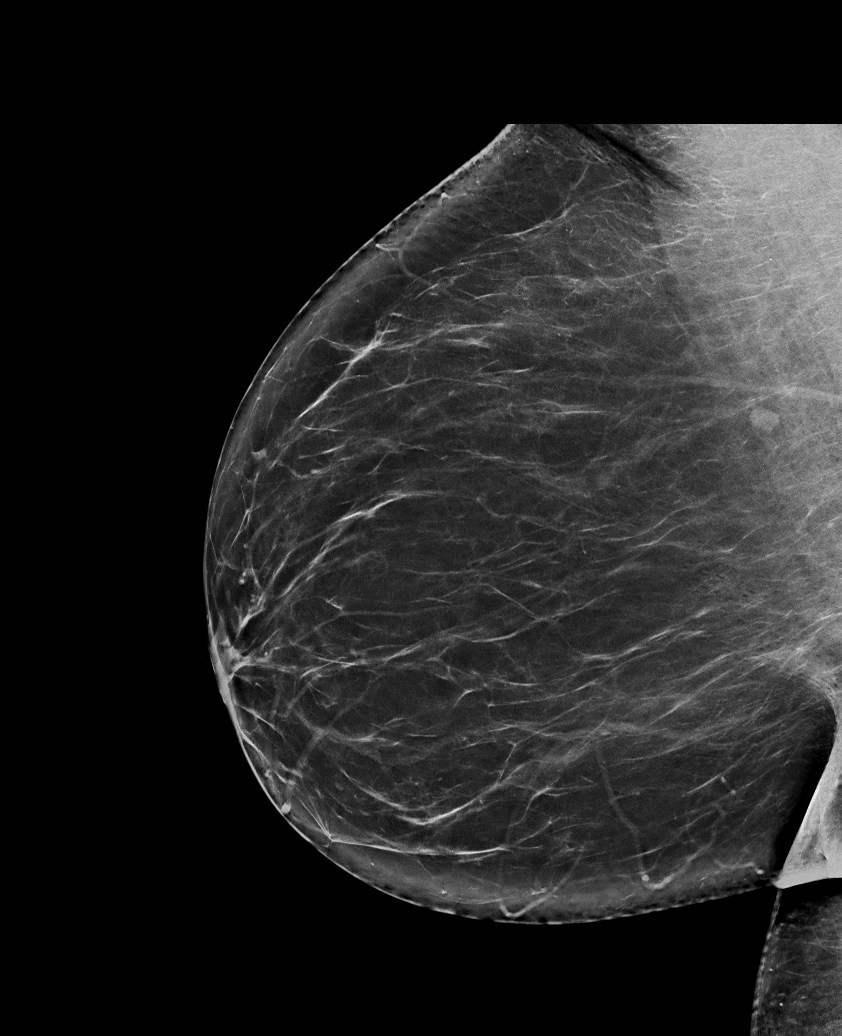

[L MLO synth-2D (1 of 2)]
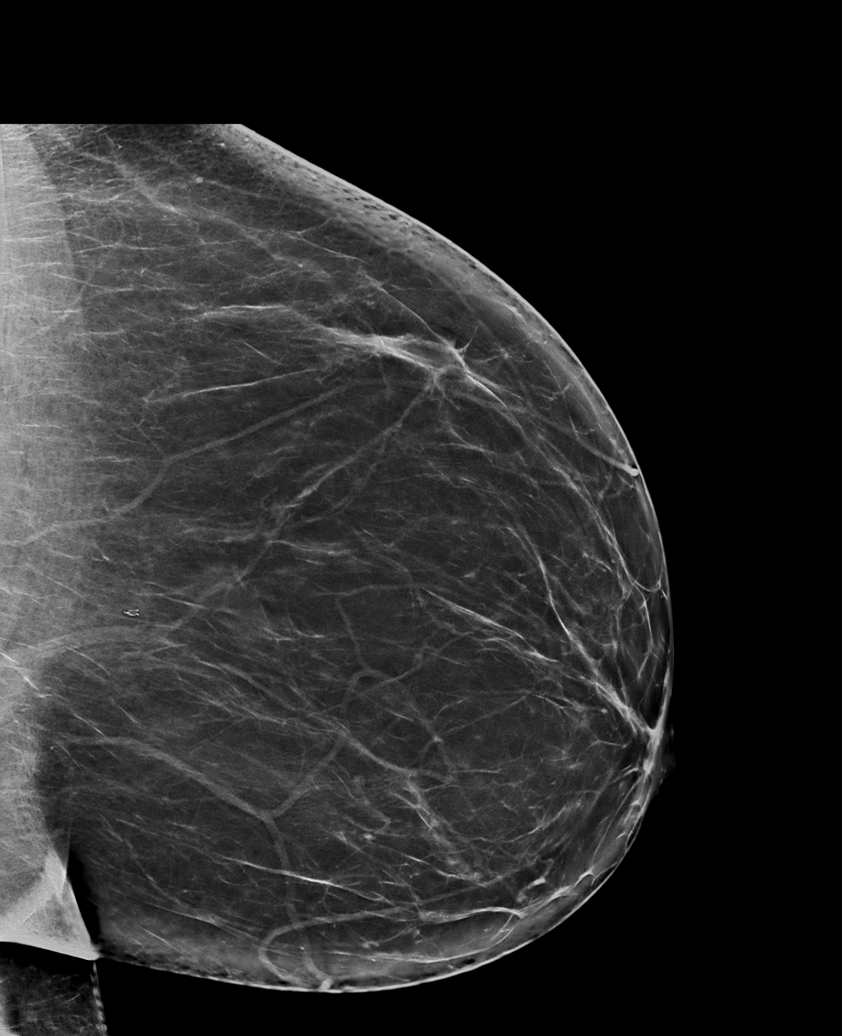

[L MLO synth-2D (2 of 2)]
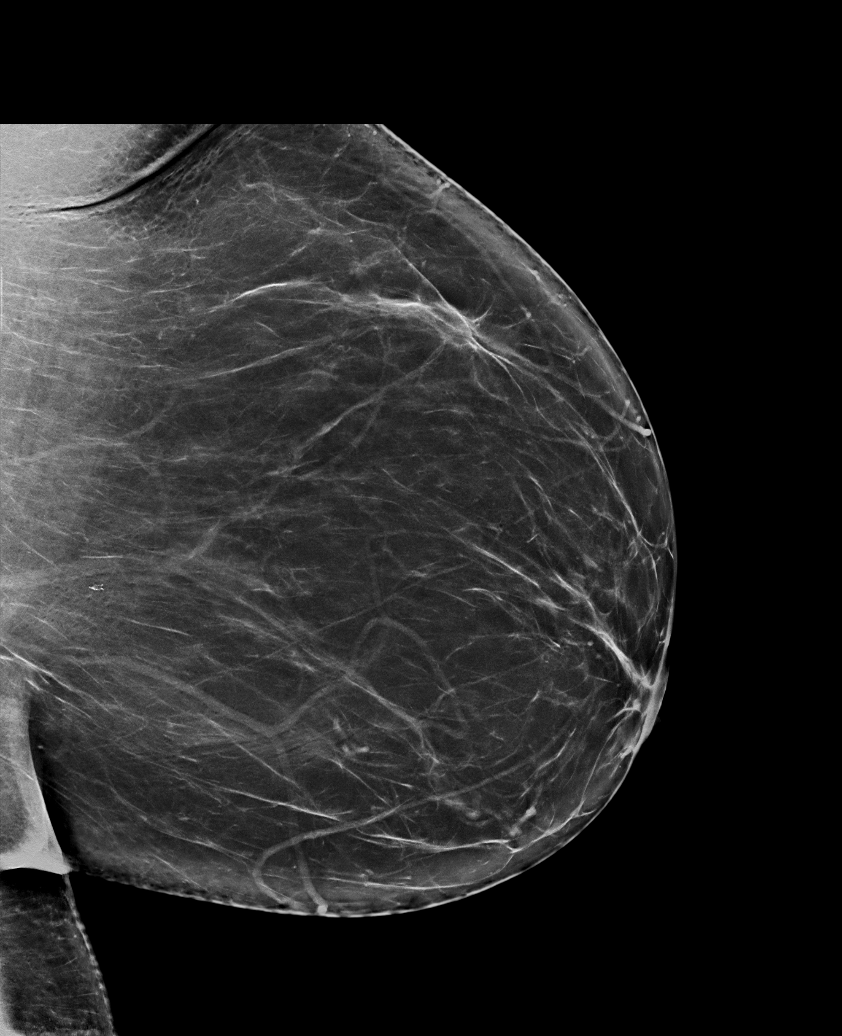

[L CC synth-2D]
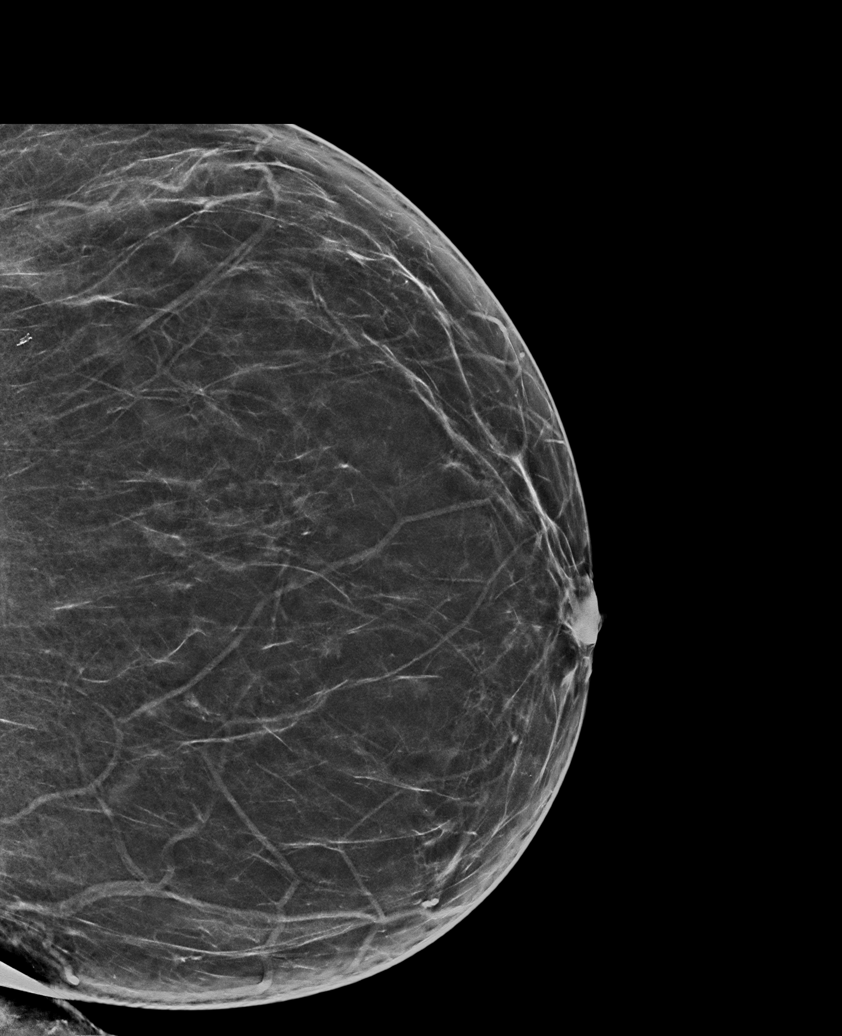

[R CC synth-2D]
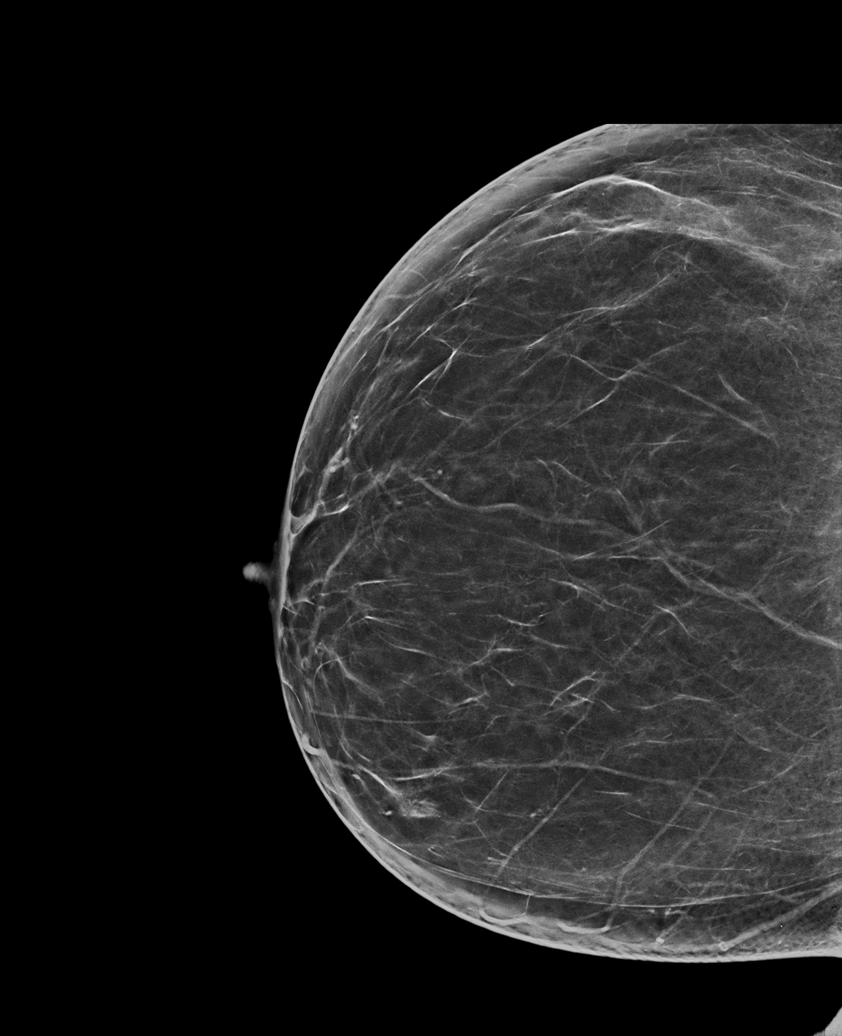

[R MLO tomo · tomo slice 45/89.0]
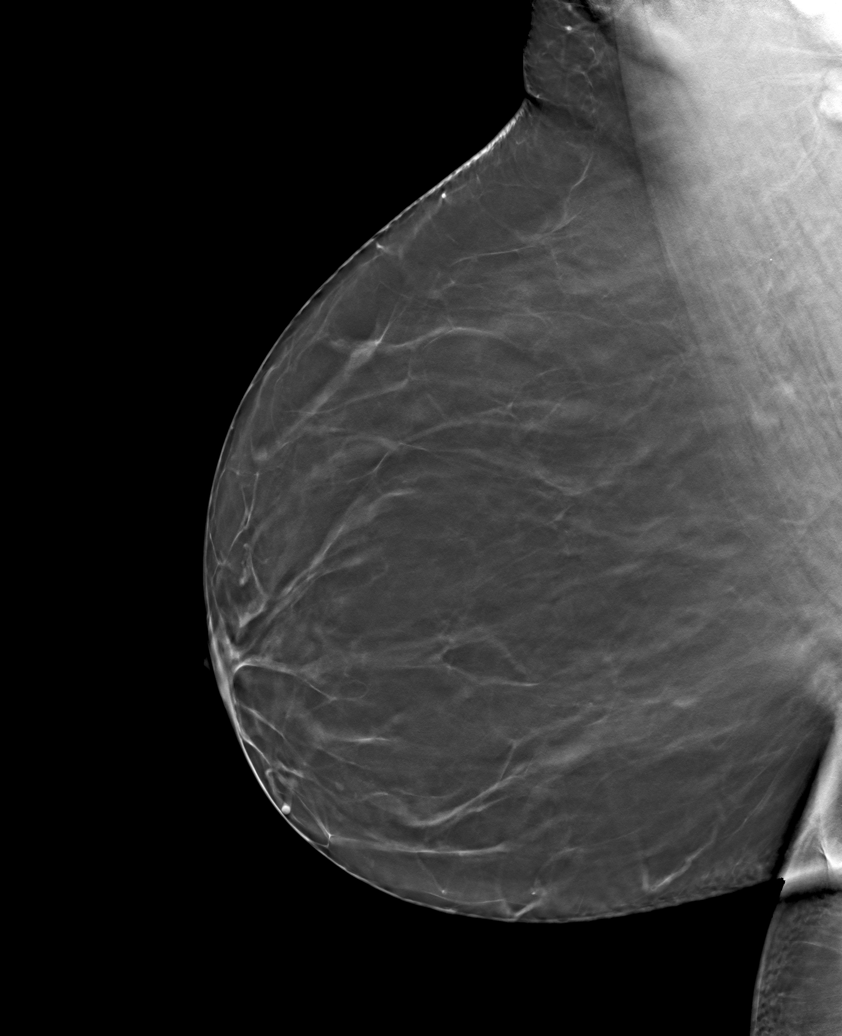

[6 of 30 positions shown; findings below may reference images not displayed]

ACR Breast Density Category b: There are scattered areas of
fibroglandular density.
FINDINGS: There are no findings suspicious for malignancy. Images were
processed with CAD.
IMPRESSION: No mammographic evidence of malignancy. A result letter of this
screening mammogram will be mailed directly to the patient.

RECOMMENDATION:
Screening mammogram in one year. (Code:CN-[id number])

BI-RADS CATEGORY  1: Negative.

## 2022-12-29 LAB — HM DIABETES EYE EXAM

## 2023-09-20 IMAGING — CT CT ABD-PELV W/ CM
2 of 5 series · 15 of 46 positions shown, 17 images · IV contrast (APPLIED)
Comparison: None.

CLINICAL DATA: Right lower quadrant pain

EXAM:
CT ABDOMEN AND PELVIS WITH CONTRAST
TECHNIQUE: Multidetector CT imaging of the abdomen and pelvis was performed
using the standard protocol following bolus administration of
intravenous contrast.
CONTRAST:  100mL OMNIPAQUE IOHEXOL 300 MG/ML  SOLN

[Series 2: axial st · axial · 0.69mm/px · z∈[-507,-62]mm · 12 of 101 slices shown, 14 images]
[im 6/101  soft-tissue]
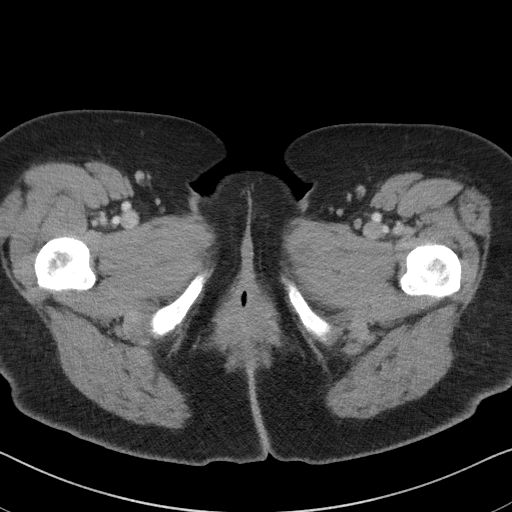
[im 6/101  bone]
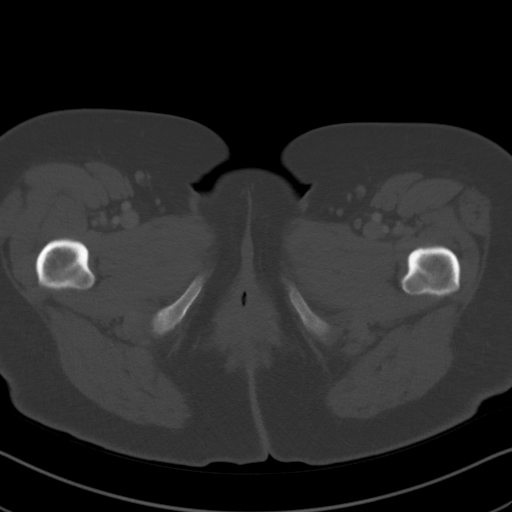
[im 16/101  soft-tissue]
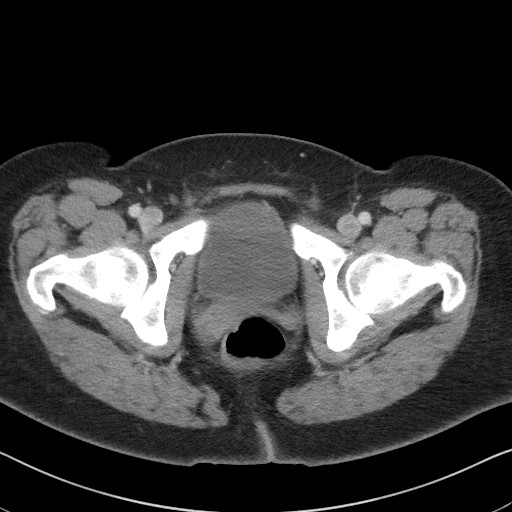
[im 22/101  soft-tissue]
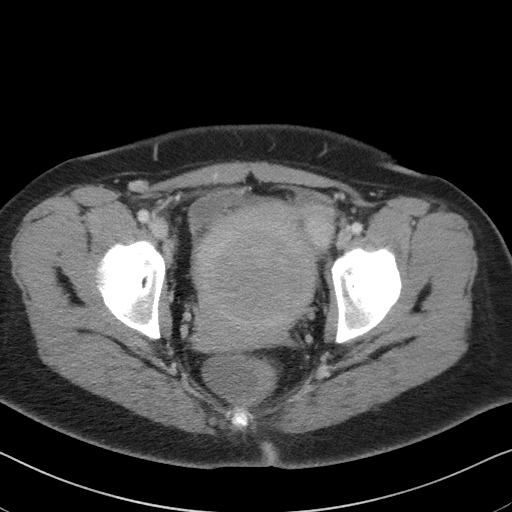
[im 32/101  soft-tissue]
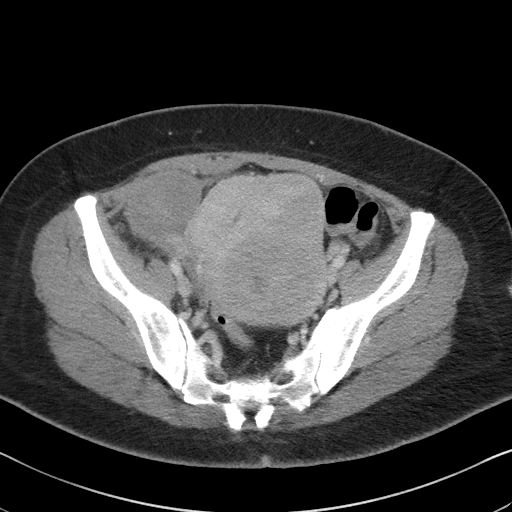
[im 37/101  soft-tissue]
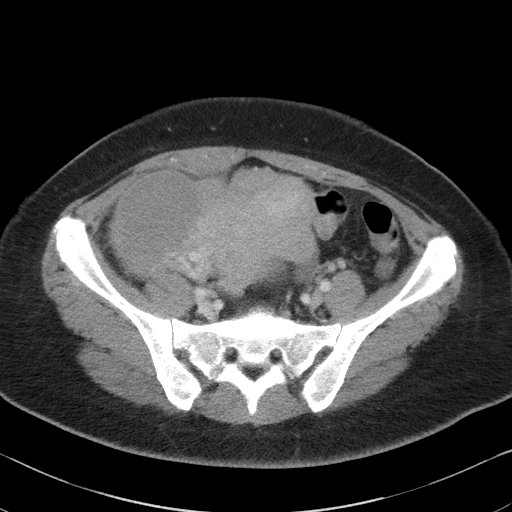
[im 48/101  soft-tissue]
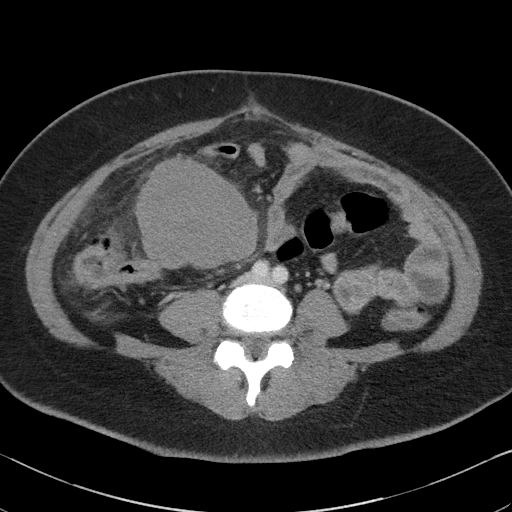
[im 53/101  soft-tissue]
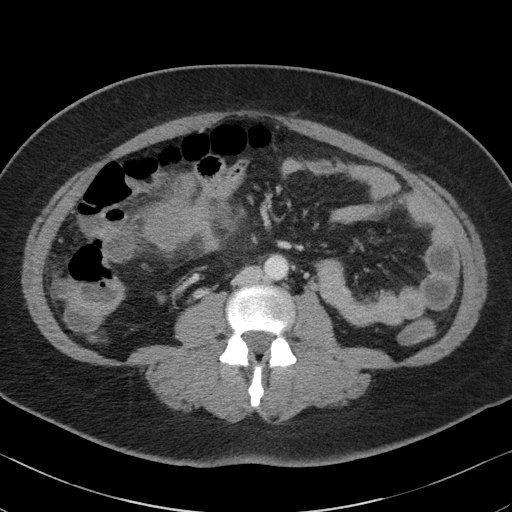
[im 64/101  soft-tissue]
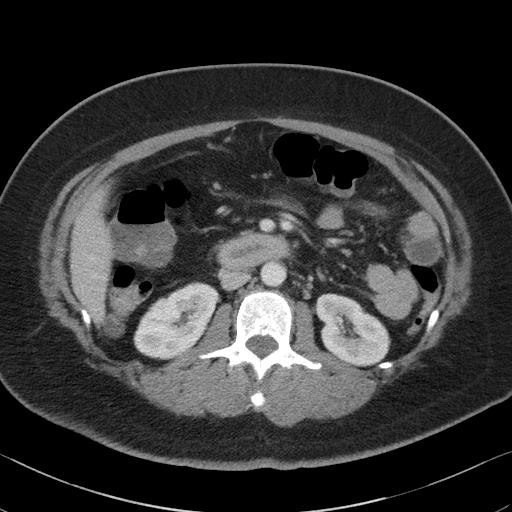
[im 69/101  soft-tissue]
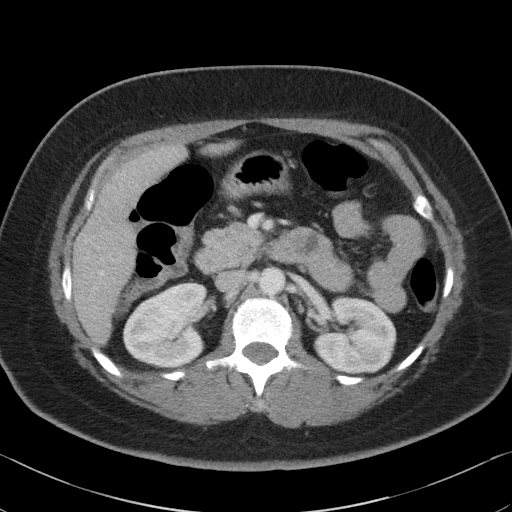
[im 69/101  bone]
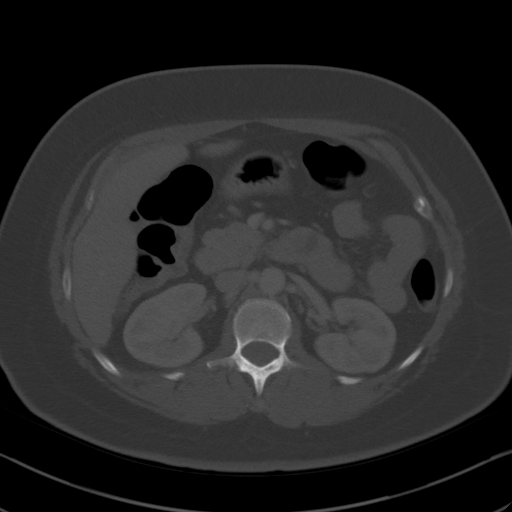
[im 79/101  soft-tissue]
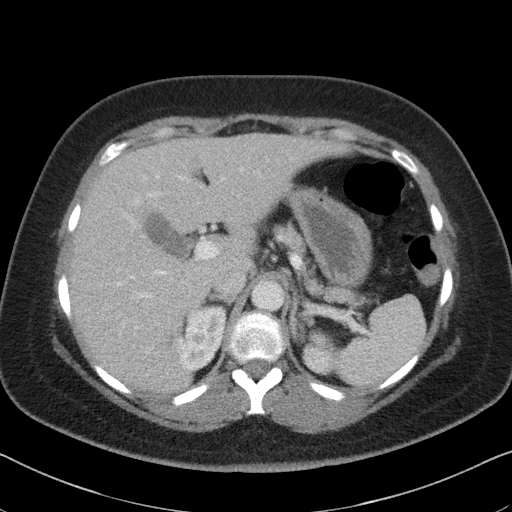
[im 85/101  soft-tissue]
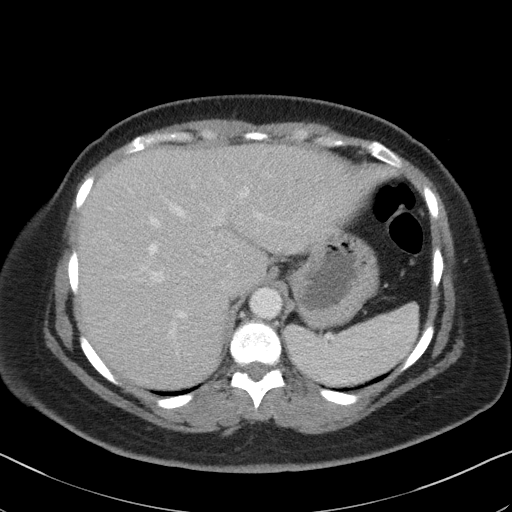
[im 95/101  soft-tissue]
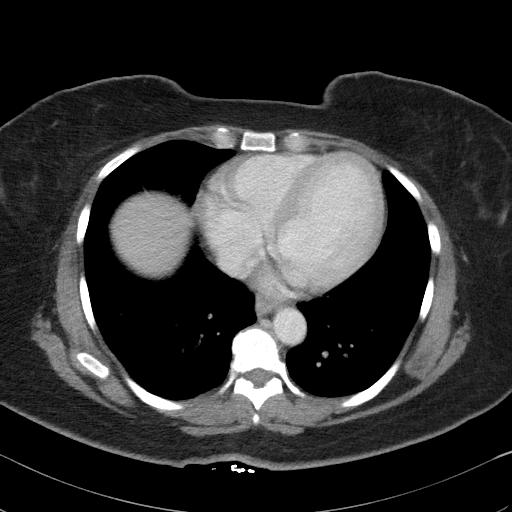

[Series 5: coronal st · coronal · 0.70mm/px · 3 of 92 slices shown]
[im 31/92  soft-tissue]
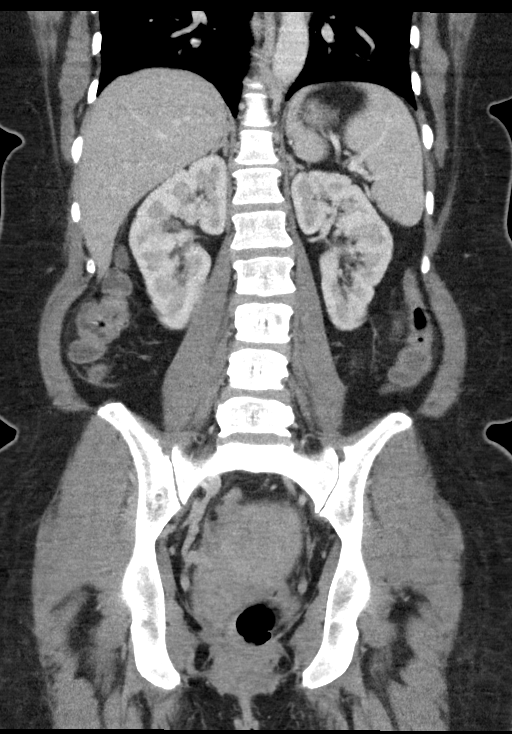
[im 41/92  soft-tissue]
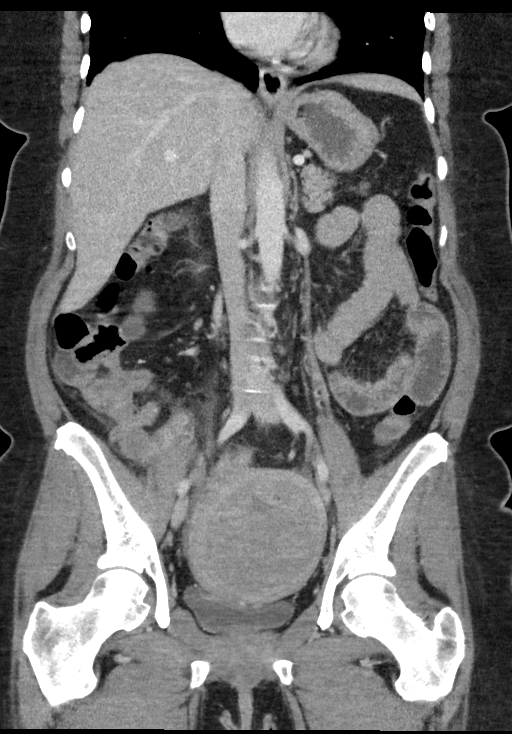
[im 51/92  soft-tissue]
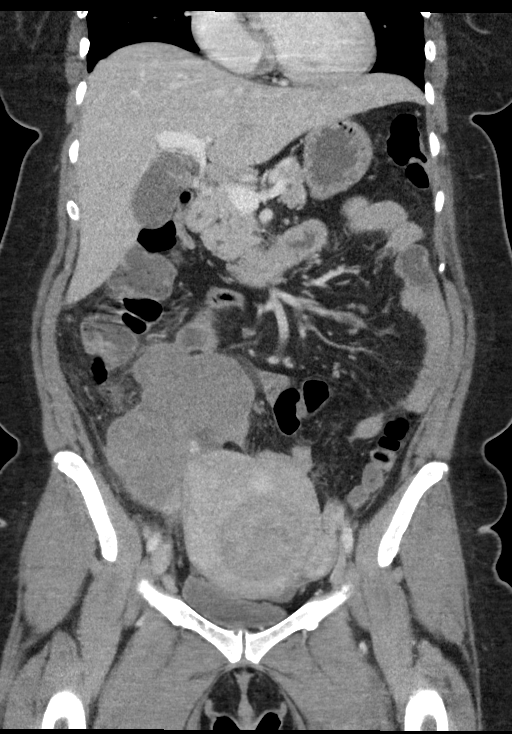

[15 of 46 positions shown; findings below may reference images not displayed]

FINDINGS: Lower chest: Mild subsegmental atelectatic changes.

Hepatobiliary: No focal liver abnormality is seen. No gallstones,
gallbladder wall thickening, or biliary dilatation.

Pancreas: Unremarkable. No pancreatic ductal dilatation or
surrounding inflammatory changes.

Spleen: Normal in size without focal abnormality.

Adrenals/Urinary Tract: Adrenal glands are unremarkable. Kidneys are
normal, without renal calculi, focal lesion, or hydronephrosis.
Bladder is unremarkable.

Stomach/Bowel: Stomach appears unremarkable. No bowel obstruction,
free air or pneumatosis identified.

Vascular/Lymphatic: No significant vascular findings are present. No
enlarged abdominal or pelvic lymph nodes.

Reproductive: Uterus is enlarged, lobulated and heterogeneous with
rounded masses measuring up to 8.2 cm, likely representing fibroids.

Other: There is a somewhat lobulated and bilobed appearing mass in
the right lower quadrant of the abdomen/pelvis which measures up to
7.2 x 10.1 x 12.8 cm in AP, transverse and craniocaudal dimensions.
The mass is fairly homogeneous overall measuring approximally 45
Hounsfield unit density throughout with a few scattered coarse
irregular calcifications centrally and mild-to-moderate surrounding
fat stranding. The mass abuts and could potentially arise from the
right ovary, cecum, appendix. No frank ascites visualized.

Musculoskeletal: No acute or significant osseous findings.
IMPRESSION: 1. Large complex mass in the right lower abdomen/pelvis as
described. Indeterminate origin of the mass which abuts and could
potentially arise from the right ovary, cecum, appendix. Further
evaluation with MRI may be helpful. Correlate clinically and
surgical consultation recommended.
2. Enlarged fibroid uterus.

## 2023-11-12 ENCOUNTER — Encounter: Payer: Self-pay | Admitting: Medical

## 2024-01-02 LAB — HM DIABETES EYE EXAM
